# Patient Record
Sex: Male | Born: 1965 | Race: White | Hispanic: No | Marital: Married | Smoking: Never smoker
Health system: Southern US, Community
[De-identification: ages and names within clinical notes are randomized; demographics above are authoritative.]

## PROBLEM LIST (undated history)

## (undated) DIAGNOSIS — N200 Calculus of kidney: Secondary | ICD-10-CM

## (undated) DIAGNOSIS — S060XAA Concussion with loss of consciousness status unknown, initial encounter: Secondary | ICD-10-CM

## (undated) DIAGNOSIS — G5600 Carpal tunnel syndrome, unspecified upper limb: Secondary | ICD-10-CM

## (undated) DIAGNOSIS — Z87442 Personal history of urinary calculi: Secondary | ICD-10-CM

## (undated) DIAGNOSIS — H9313 Tinnitus, bilateral: Secondary | ICD-10-CM

## (undated) DIAGNOSIS — S060X9A Concussion with loss of consciousness of unspecified duration, initial encounter: Secondary | ICD-10-CM

## (undated) HISTORY — PX: CYSTOSCOPY: SUR368

## (undated) HISTORY — PX: BILATERAL CARPAL TUNNEL RELEASE: SHX6508

---

## 2016-01-05 ENCOUNTER — Other Ambulatory Visit: Payer: Self-pay | Admitting: Internal Medicine

## 2016-01-05 DIAGNOSIS — R1031 Right lower quadrant pain: Secondary | ICD-10-CM

## 2016-01-05 DIAGNOSIS — R1084 Generalized abdominal pain: Secondary | ICD-10-CM

## 2018-02-24 ENCOUNTER — Emergency Department (HOSPITAL_COMMUNITY): Payer: No Typology Code available for payment source

## 2018-02-24 ENCOUNTER — Emergency Department (HOSPITAL_COMMUNITY)
Admission: EM | Admit: 2018-02-24 | Discharge: 2018-02-24 | Disposition: A | Discharge status: Home / Self Care | Payer: No Typology Code available for payment source | Attending: Emergency Medicine | Admitting: Emergency Medicine

## 2018-02-24 ENCOUNTER — Other Ambulatory Visit: Payer: Self-pay

## 2018-02-24 ENCOUNTER — Encounter (HOSPITAL_COMMUNITY): Payer: Self-pay

## 2018-02-24 DIAGNOSIS — N200 Calculus of kidney: Secondary | ICD-10-CM | POA: Diagnosis not present

## 2018-02-24 DIAGNOSIS — R1032 Left lower quadrant pain: Secondary | ICD-10-CM | POA: Diagnosis present

## 2018-02-24 HISTORY — DX: Concussion with loss of consciousness of unspecified duration, initial encounter: S06.0X9A

## 2018-02-24 HISTORY — DX: Concussion with loss of consciousness status unknown, initial encounter: S06.0XAA

## 2018-02-24 HISTORY — DX: Calculus of kidney: N20.0

## 2018-02-24 LAB — CBC
HEMATOCRIT: 46.7 % (ref 39.0–52.0)
Hemoglobin: 16.2 g/dL (ref 13.0–17.0)
MCH: 30.5 pg (ref 26.0–34.0)
MCHC: 34.7 g/dL (ref 30.0–36.0)
MCV: 87.8 fL (ref 78.0–100.0)
Platelets: 253 10*3/uL (ref 150–400)
RBC: 5.32 MIL/uL (ref 4.22–5.81)
RDW: 12.7 % (ref 11.5–15.5)
WBC: 5.9 10*3/uL (ref 4.0–10.5)

## 2018-02-24 LAB — COMPREHENSIVE METABOLIC PANEL
ALK PHOS: 73 U/L (ref 38–126)
ALT: 39 U/L (ref 0–44)
AST: 34 U/L (ref 15–41)
Albumin: 4.6 g/dL (ref 3.5–5.0)
Anion gap: 11 (ref 5–15)
BILIRUBIN TOTAL: 1.8 mg/dL — AB (ref 0.3–1.2)
BUN: 17 mg/dL (ref 6–20)
CALCIUM: 9.5 mg/dL (ref 8.9–10.3)
CO2: 23 mmol/L (ref 22–32)
CREATININE: 1.1 mg/dL (ref 0.61–1.24)
Chloride: 107 mmol/L (ref 98–111)
Glucose, Bld: 107 mg/dL — ABNORMAL HIGH (ref 70–99)
Potassium: 4.1 mmol/L (ref 3.5–5.1)
Sodium: 141 mmol/L (ref 135–145)
TOTAL PROTEIN: 7.9 g/dL (ref 6.5–8.1)

## 2018-02-24 LAB — URINALYSIS, ROUTINE W REFLEX MICROSCOPIC
BILIRUBIN URINE: NEGATIVE
GLUCOSE, UA: NEGATIVE mg/dL
Ketones, ur: NEGATIVE mg/dL
LEUKOCYTES UA: NEGATIVE
Nitrite: NEGATIVE
PH: 8 (ref 5.0–8.0)
Protein, ur: 30 mg/dL — AB
RBC / HPF: >50 RBC/hpf — ABNORMAL HIGH (ref 0–5)
SPECIFIC GRAVITY, URINE: 1.023 (ref 1.005–1.030)

## 2018-02-24 LAB — LIPASE, BLOOD: LIPASE: 31 U/L (ref 11–51)

## 2018-02-24 MED ORDER — ONDANSETRON 4 MG PO TBDP: 4.0000 mg | ORAL_TABLET | Freq: Three times a day (TID) | ORAL | 0 refills | Status: AC | PRN

## 2018-02-24 MED ORDER — ONDANSETRON HCL 4 MG/2ML IJ SOLN
4.0000 mg | Freq: Once | INTRAMUSCULAR | Status: AC
  Administered 2018-02-24: 4 mg via INTRAVENOUS
  Filled 2018-02-24: qty 2

## 2018-02-24 MED ORDER — TAMSULOSIN HCL 0.4 MG PO CAPS: 0.4000 mg | ORAL_CAPSULE | Freq: Every day | ORAL | 0 refills | Status: DC

## 2018-02-24 MED ORDER — OXYCODONE-ACETAMINOPHEN 5-325 MG PO TABS: 1.0000 | ORAL_TABLET | Freq: Four times a day (QID) | ORAL | 0 refills | Status: AC | PRN

## 2018-02-24 MED ORDER — CEPHALEXIN 500 MG PO CAPS: 500.0000 mg | ORAL_CAPSULE | Freq: Four times a day (QID) | ORAL | 0 refills | Status: DC

## 2018-02-24 MED ORDER — SODIUM CHLORIDE 0.9 % IV BOLUS
1000.0000 mL | Freq: Once | INTRAVENOUS | Status: AC
  Administered 2018-02-24: 1000 mL via INTRAVENOUS

## 2018-02-24 MED ORDER — IBUPROFEN 800 MG PO TABS: 800.0000 mg | ORAL_TABLET | Freq: Three times a day (TID) | ORAL | 0 refills | Status: AC

## 2018-02-24 MED ORDER — IBUPROFEN 800 MG PO TABS: 800.0000 mg | ORAL_TABLET | Freq: Three times a day (TID) | ORAL | 0 refills | Status: DC

## 2018-02-24 MED ORDER — ONDANSETRON 4 MG PO TBDP: 4.0000 mg | ORAL_TABLET | Freq: Three times a day (TID) | ORAL | 0 refills | Status: DC | PRN

## 2018-02-24 MED ORDER — KETOROLAC TROMETHAMINE 30 MG/ML IJ SOLN
30.0000 mg | Freq: Once | INTRAMUSCULAR | Status: AC
  Administered 2018-02-24: 30 mg via INTRAVENOUS
  Filled 2018-02-24: qty 1

## 2018-02-24 MED ORDER — MORPHINE SULFATE (PF) 4 MG/ML IV SOLN
4.0000 mg | Freq: Once | INTRAVENOUS | Status: AC
  Administered 2018-02-24: 4 mg via INTRAVENOUS
  Filled 2018-02-24: qty 1

## 2018-02-24 MED ORDER — CEPHALEXIN 500 MG PO CAPS: 500.0000 mg | ORAL_CAPSULE | Freq: Four times a day (QID) | ORAL | 0 refills | Status: AC

## 2018-02-24 NOTE — ED Provider Notes (Signed)
[facility]-EMERGENCY DEPT Provider Note   CSN: [id number] Arrival date & time: 02/24/18  4098   History   Chief Complaint Chief Complaint  Patient presents with  . Abdominal Pain    HPI Nathan Dodson is a 52 y.o. male with history of nephrolithiasis who presents to the emergency department with complaints of abdominal pain which started shortly prior to arrival after eating breakfast this morning around 07:30.  Patient states pain is located in the left lower quadrant/left suprapubic area, it has been constant since onset and progressively worsening.  Current pain is a 10 out of 10 in severity without specific alleviating or aggravating factors.  No interventions prior to arrival.  He does report associated nausea without vomiting as well as urinary urgency with urine that appears darker than usual.  Denies fever, chills, vomiting, diarrhea, blood in stool, testicular pain/swelling, or dysuria.  Patient does have a history of kidney stones however this was a long ago he cannot tell if this is similar.  HPI  Past Medical History:  Diagnosis Date  . Concussion   . Kidney stone     There are no active problems to display for this patient.   History reviewed. No pertinent surgical history.      Home Medications    Prior to Admission medications   Not on File    Family History History reviewed. No pertinent family history.  Social History Social History   Tobacco Use  . Smoking status: Never Smoker  . Smokeless tobacco: Never Used  Substance Use Topics  . Alcohol use: Not Currently  . Drug use: Not Currently     Allergies   Bactrim [sulfamethoxazole-trimethoprim] and Ciprofloxacin   Review of Systems Review of Systems  Constitutional: Negative for chills and fever.  Respiratory: Negative for shortness of breath.   Cardiovascular: Negative for chest pain.  Gastrointestinal: Positive for abdominal pain and nausea. Negative for blood in  stool, constipation, diarrhea and vomiting.  Genitourinary: Positive for urgency. Negative for dysuria, hematuria, scrotal swelling and testicular pain.       Positive for dark in urine.  All other systems reviewed and are negative.    Physical Exam Updated Vital Signs BP (!) 147/91 (BP Location: Right Arm)   Pulse 71   Temp (!) 97.5 F (36.4 C) (Oral)   Resp 20   Ht 5\' 10"  (1.778 m)   Wt 104.8 kg (231 lb)   SpO2 100%   BMI 33.15 kg/m   Physical Exam  Constitutional: He appears well-developed and well-nourished.  Non-toxic appearance. He appears distressed (Patient appears uncomfortable, pacing throughout the room.).  HENT:  Head: Normocephalic and atraumatic.  Eyes: Conjunctivae are normal. Right eye exhibits no discharge. Left eye exhibits no discharge.  Cardiovascular: Normal rate and regular rhythm.  No murmur heard. Pulmonary/Chest: Breath sounds normal. No respiratory distress. He has no wheezes. He has no rales.  Abdominal: Soft. He exhibits no distension. There is no tenderness. There is no rigidity, no rebound, no guarding, no CVA tenderness, no tenderness at McBurney's point and negative Murphy's sign.  Neurological: He is alert.  Clear speech.   Skin: Skin is warm and dry. No rash noted.  Psychiatric: He has a normal mood and affect. His behavior is normal.  Nursing note and vitals reviewed.   ED Treatments / Results  Labs Results for orders placed or performed during the hospital encounter of 02/24/18  Lipase, blood  Result Value Ref Range   Lipase 31  11 - 51 U/L  Comprehensive metabolic panel  Result Value Ref Range   Sodium 141 135 - 145 mmol/L   Potassium 4.1 3.5 - 5.1 mmol/L   Chloride 107 98 - 111 mmol/L   CO2 23 22 - 32 mmol/L   Glucose, Bld 107 (H) 70 - 99 mg/dL   BUN 17 6 - 20 mg/dL   Creatinine, Ser 4.09 0.61 - 1.24 mg/dL   Calcium 9.5 8.9 - 81.1 mg/dL   Total Protein 7.9 6.5 - 8.1 g/dL   Albumin 4.6 3.5 - 5.0 g/dL   AST 34 15 - 41 U/L   ALT  39 0 - 44 U/L   Alkaline Phosphatase 73 38 - 126 U/L   Total Bilirubin 1.8 (H) 0.3 - 1.2 mg/dL   GFR calc non Af Amer >60 >60 mL/min   GFR calc Af Amer >60 >60 mL/min   Anion gap 11 5 - 15  CBC  Result Value Ref Range   WBC 5.9 4.0 - 10.5 K/uL   RBC 5.32 4.22 - 5.81 MIL/uL   Hemoglobin 16.2 13.0 - 17.0 g/dL   HCT 91.4 78.2 - 95.6 %   MCV 87.8 78.0 - 100.0 fL   MCH 30.5 26.0 - 34.0 pg   MCHC 34.7 30.0 - 36.0 g/dL   RDW 21.3 08.6 - 57.8 %   Platelets 253 150 - 400 K/uL  Urinalysis, Routine w reflex microscopic  Result Value Ref Range   Color, Urine AMBER (A) YELLOW   APPearance CLOUDY (A) CLEAR   Specific Gravity, Urine 1.023 1.005 - 1.030   pH 8.0 5.0 - 8.0   Glucose, UA NEGATIVE NEGATIVE mg/dL   Hgb urine dipstick LARGE (A) NEGATIVE   Bilirubin Urine NEGATIVE NEGATIVE   Ketones, ur NEGATIVE NEGATIVE mg/dL   Protein, ur 30 (A) NEGATIVE mg/dL   Nitrite NEGATIVE NEGATIVE   Leukocytes, UA NEGATIVE NEGATIVE   RBC / HPF >50 (H) 0 - 5 RBC/hpf   WBC, UA 0-5 0 - 5 WBC/hpf   Bacteria, UA MANY (A) NONE SEEN   Squamous Epithelial / LPF 0-5 0 - 5   Mucus PRESENT    EKG None  Radiology Ct Renal Stone Study  Result Date: 02/24/2018 CLINICAL DATA:  Left side abdominal pain EXAM: CT ABDOMEN AND PELVIS WITHOUT CONTRAST TECHNIQUE: Multidetector CT imaging of the abdomen and pelvis was performed following the standard protocol without IV contrast. COMPARISON:  None. FINDINGS: Lower chest: Lung bases are clear. No effusions. Heart is normal size. Hepatobiliary: No focal hepatic abnormality. Gallbladder unremarkable. Pancreas: No focal abnormality or ductal dilatation. Spleen: No focal abnormality.  Normal size. Adrenals/Urinary Tract: Mild left hydronephrosis. 3 mm distal left ureteral stone. Bilateral nonobstructing stones. Adrenal glands and urinary bladder unremarkable. Stomach/Bowel: Stomach, large and small bowel grossly unremarkable. Vascular/Lymphatic: No evidence of aneurysm or  adenopathy. Reproductive: No visible focal abnormality. Other: No free fluid or free air. Small bilateral inguinal hernias containing fat. Musculoskeletal: No acute bony abnormality. IMPRESSION: Bilateral nephrolithiasis. 3 mm distal left ureteral stone with mild left hydronephrosis. Electronically Signed   By: Charlett Nose M.D.   On: 02/24/2018 09:57    Procedures Procedures (including critical care time)  Medications Ordered in ED Medications  ketorolac (TORADOL) 30 MG/ML injection 30 mg (has no administration in time range)  sodium chloride 0.9 % bolus 1,000 mL (1,000 mLs Intravenous New Bag/Given 02/24/18 0931)  morphine 4 MG/ML injection 4 mg (4 mg Intravenous Given 02/24/18 0924)  ondansetron (ZOFRAN) injection 4  mg (4 mg Intravenous Given 02/24/18 0924)     Initial Impression / Assessment and Plan / ED Course  I have reviewed the triage vital signs and the nursing notes.  Pertinent labs & imaging results that were available during my care of the patient were reviewed by me and considered in my medical decision making (see chart for details).   Patient presents to the emergency department with complaint of abdominal pain.  Patient appears uncomfortable, nontoxic, BP is elevated, doubt HTN emergency at this time.  No peritoneal signs on exam.  DDX: Nephrolithiasis, pyelonephritis, UTI, appendicitis, diverticulitis, bowel obstruction/perforation, dissection, with nephrolithiasis as most likely at this time.  Will evaluate with labs and CT renal study, will give fluids, morphine, and Zofran.  Work-up reviewed: No leukocytosis.  No anemia.  No significant electrolyte derangements.  LFTs and lipase are WNL.  CT renal study confirms 3 mm distal left ureteral stone with mild left hydronephrosis, renal fxn WNL, patient urinalysis does show many bacteria as well as hematuria, it is nitrite and leukocyte negative only 0-5 white blood cells.- spoke with supervising physician Dr. Deretha EmoryZackowski- recommends  abx coverage with keflex as well as flomax and pain control with urology follow up.   11:40: RE-EVAL: Discussed results with family and patient at bedside. Following Toradol patient is feeling much better, he states that he feels ready to go home, he is tolerating PO.   Patient with 3 mm kidney stone diagnosed on CT study, renal function preserved, will provide ibuprofen, percocet, tamsulosin, and zofran, will additionally cover for infection with keflex. [place] Cont[place] Substance reporting System queried. Strainer provided. Urology follow up. I discussed results, treatment plan, need for follow-up, and return precautions with the patient. Provided opportunity for questions, patient confirmed understanding and is in agreement with plan.   Findings and plan of care discussed with supervising physician Dr. Deretha EmoryZackowski- in agreement with plan.   Final Clinical Impressions(s) / ED Diagnoses   Final diagnoses:  Kidney stone    ED Discharge Orders        Ordered    ibuprofen (ADVIL,MOTRIN) 800 MG tablet  3 times daily     02/24/18 1149    oxyCODONE-acetaminophen (PERCOCET/ROXICET) 5-325 MG tablet  Every 6 hours PRN     02/24/18 1149    tamsulosin (FLOMAX) 0.4 MG CAPS capsule  Daily after supper     02/24/18 1149    ondansetron (ZOFRAN ODT) 4 MG disintegrating tablet  Every 8 hours PRN     02/24/18 1149    cephALEXin (KEFLEX) 500 MG capsule  4 times daily     02/24/18 1149       Zeferino Mounts, Kelleys IslandSamantha R, PA-C 02/24/18 1519    Vanetta MuldersZackowski, Scott, MD 02/27/18 1603

## 2018-02-24 NOTE — Discharge Instructions (Addendum)
You were seen in the emergency department today and found to have a kidney stone on the left side.  We are sending you home with the following medications: -Flomax-this is a medicine to help pass the kidney stone, take this once daily with a meal -Ibuprofen 800 mg-this is a nonsteroidal anti-inflammatory medicine to help with pain and out with passing the stone, take this every 8 hours as needed for pain.  Take this with food as it can cause stomach upset and at worst stomach bleeding.  Do not take other NSAIDs such as Motrin, Aleve, Advil, Mobic, naproxen when taking this medicine -Percocet-take this 1 to 2 tablets every 6 hours as needed for severe pain.  Do not drive or operate heavy machinery when taking this medicine do not drink alcohol with this medicine keep this out of the reach of small children.  This has the potential for addicting qualities.  This also has Tylenol in it to be sure to avoid taking more than the maximum daily dose of Tylenol if you decide to take any extra Tylenol. -Zofran-some medicine for nausea, take this every 8 hours as needed for nausea and vomiting Keflex-this is an antibiotic, there was some bacteria in your urine, we want to ensure that this is not infection as well, please take this 4 times daily.  We have prescribed you new medication(s) today. Discuss the medications prescribed today with your pharmacist as they can have adverse effects and interactions with your other medicines including over the counter and prescribed medications. Seek medical evaluation if you start to experience new or abnormal symptoms after taking one of these medicines, seek care immediately if you start to experience difficulty breathing, feeling of your throat closing, facial swelling, or rash as these could be indications of a more serious allergic reaction   Please follow-up with your urologist Dr. Alvester MorinBell in the next 3 days for reevaluation.  Return to the ER for new or worsening symptoms  including but not limited to worsening pain, inability to keep fluids down, fever, or any other concerns.   Additionally see your primary care provider within 1 month for

## 2018-02-24 NOTE — ED Triage Notes (Signed)
Pt presents with c/o abdominal pain on the left side that started this morning after eating breakfast. Pt reports some nausea but denies any vomiting or diarrhea.

## 2018-02-24 NOTE — ED Notes (Signed)
Patient transported to CT 

## 2018-02-24 NOTE — ED Notes (Signed)
STRAINER GIVEN AT DISCHARGE

## 2018-02-25 LAB — URINE CULTURE: CULTURE: NO GROWTH

## 2019-08-04 IMAGING — CT CT RENAL STONE PROTOCOL
2 of 4 series · 17 of 46 positions shown, 19 images · non-contrast
Comparison: None.

CLINICAL DATA: Left side abdominal pain

EXAM:
CT ABDOMEN AND PELVIS WITHOUT CONTRAST
TECHNIQUE: Multidetector CT imaging of the abdomen and pelvis was performed
following the standard protocol without IV contrast.

[Series 2: axial st · axial · 0.74mm/px · z∈[-466,-66]mm · 14 of 92 slices shown, 16 images]
[im 6/92  soft-tissue]
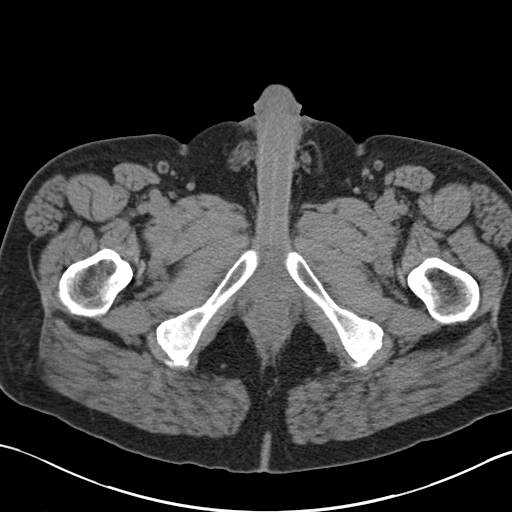
[im 6/92  bone]
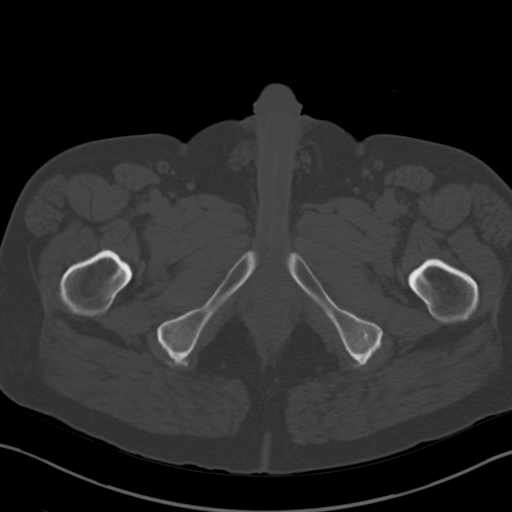
[im 11/92  soft-tissue]
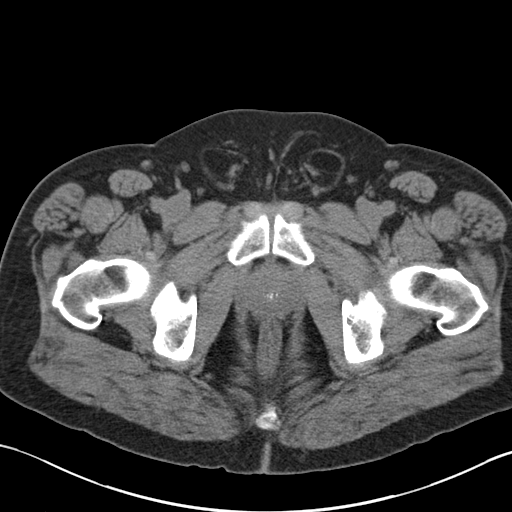
[im 17/92  soft-tissue]
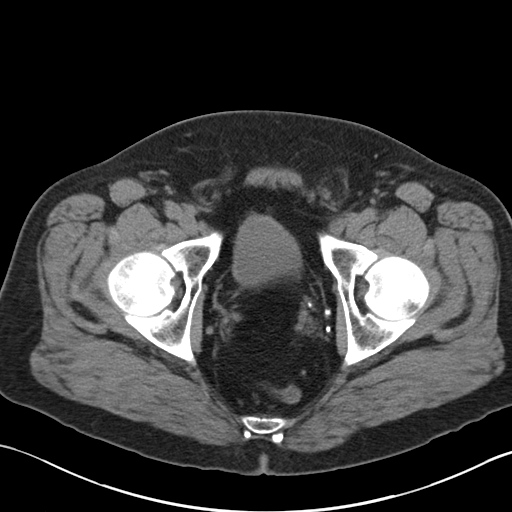
[im 27/92  soft-tissue]
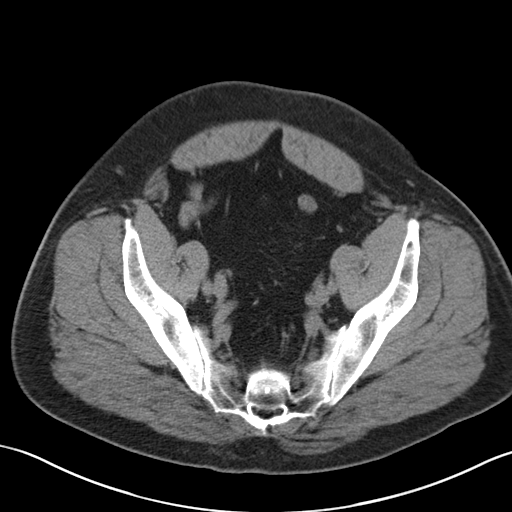
[im 33/92  soft-tissue]
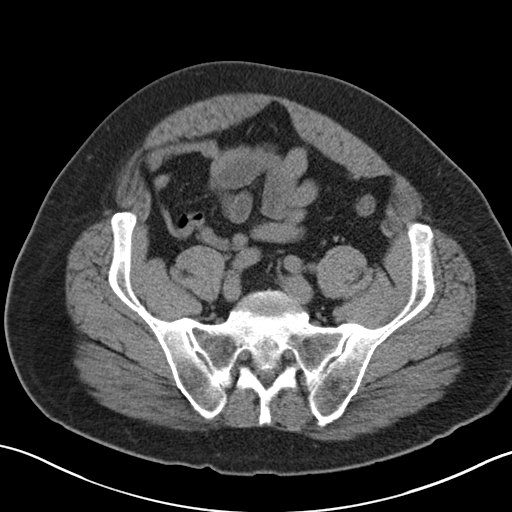
[im 38/92  soft-tissue]
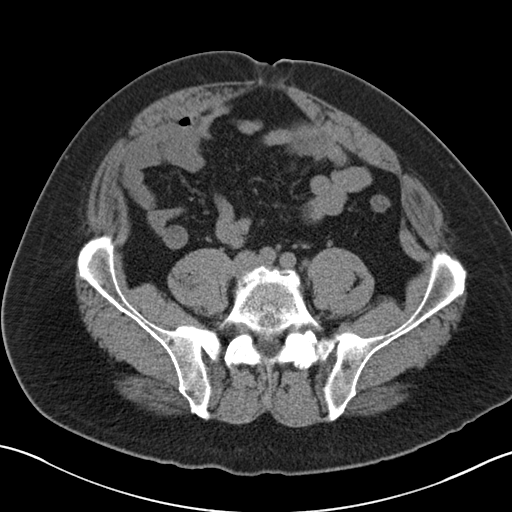
[im 43/92  soft-tissue]
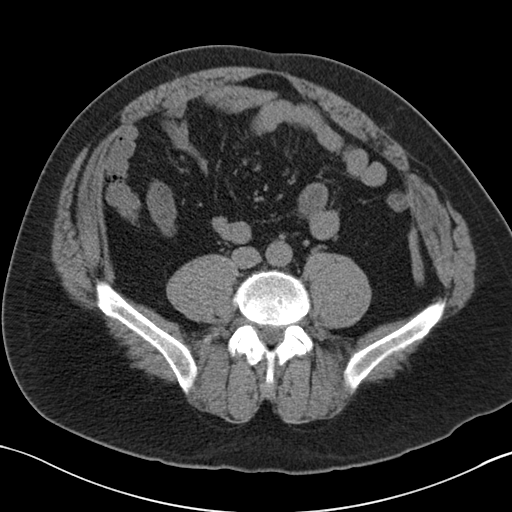
[im 49/92  soft-tissue]
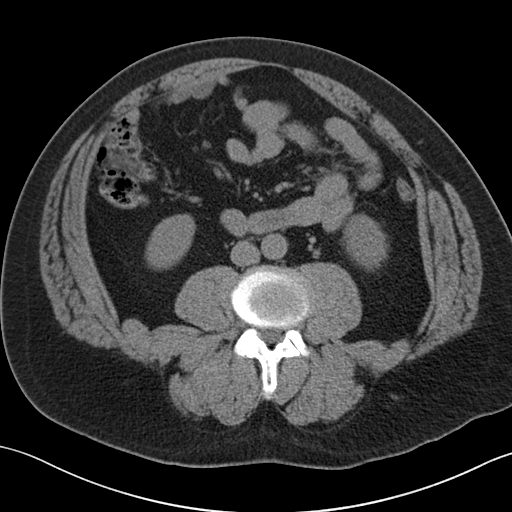
[im 54/92  soft-tissue]
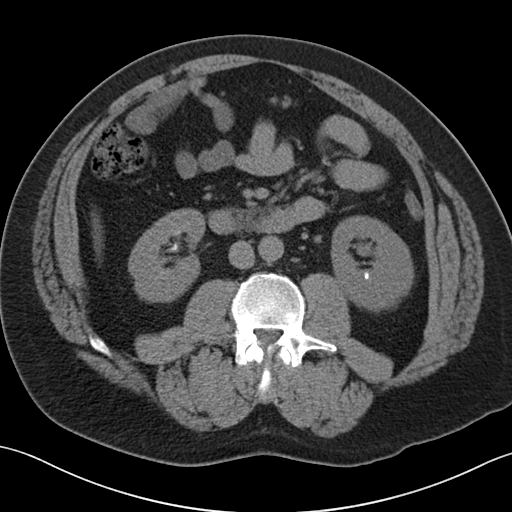
[im 54/92  bone]
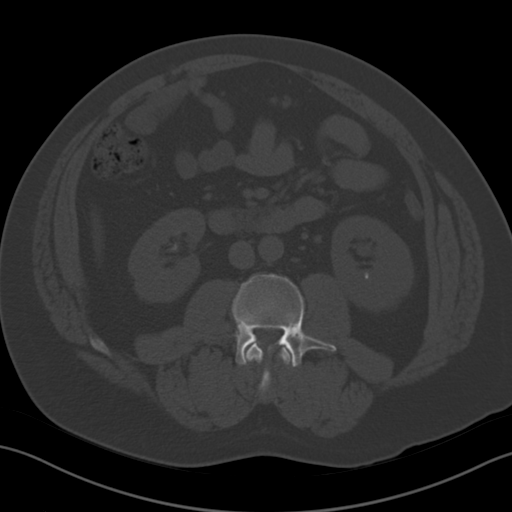
[im 59/92  soft-tissue]
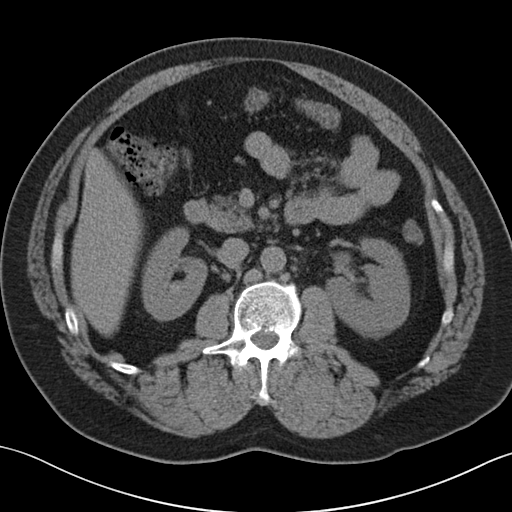
[im 70/92  soft-tissue]
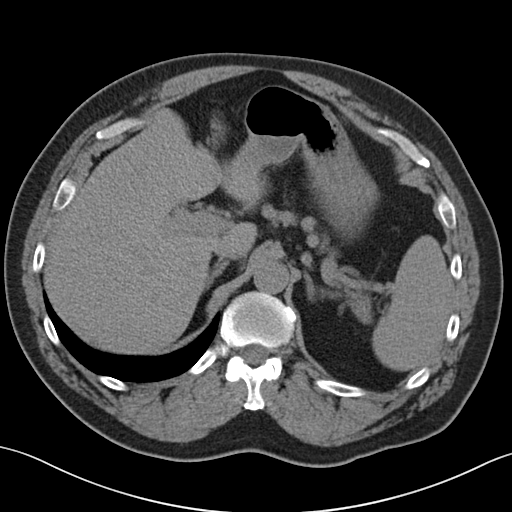
[im 75/92  soft-tissue]
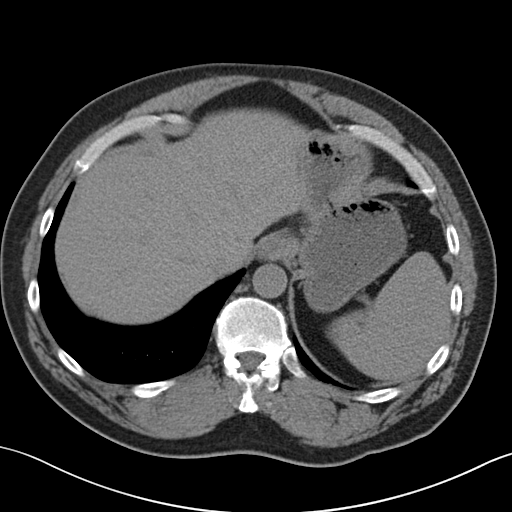
[im 81/92  soft-tissue]
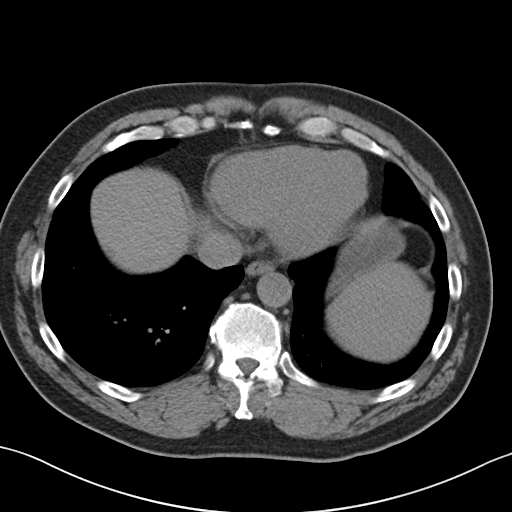
[im 86/92  soft-tissue]
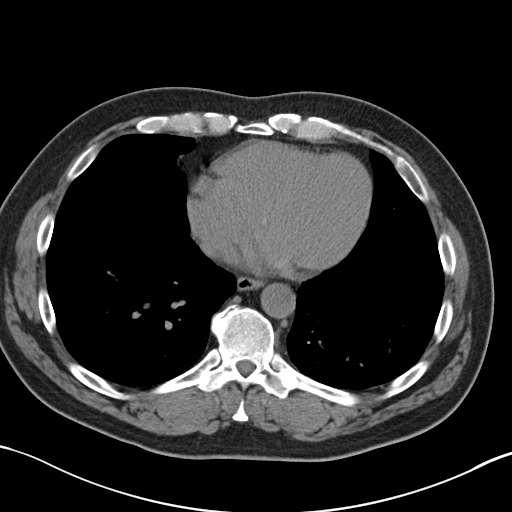

[Series 4: coronal · coronal · 0.74mm/px · 3 of 151 slices shown]
[im 51/151  soft-tissue]
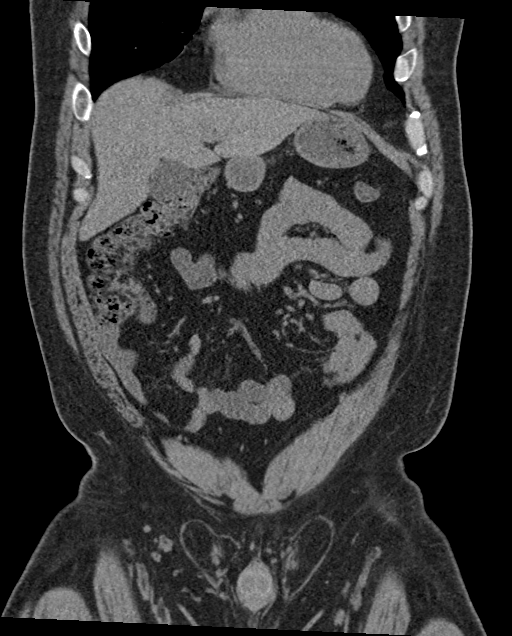
[im 67/151  soft-tissue]
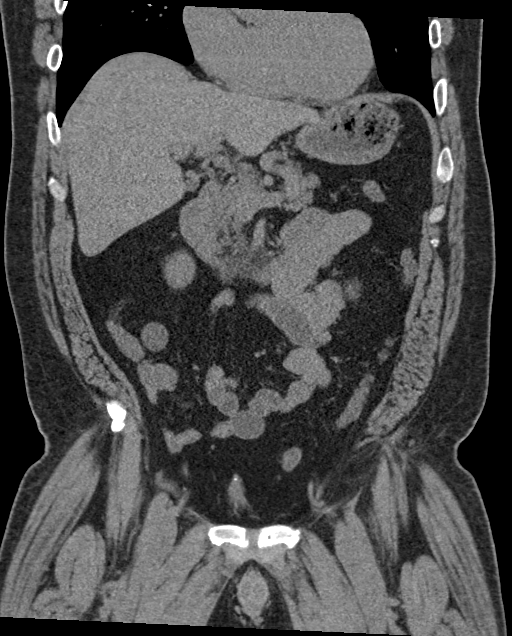
[im 84/151  soft-tissue]
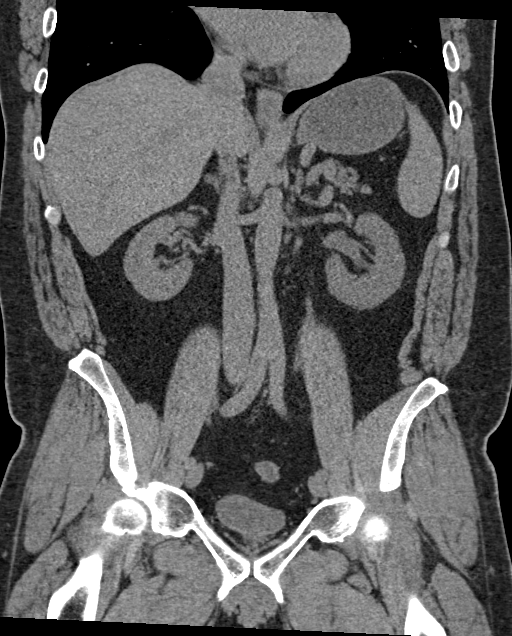

[17 of 46 positions shown; findings below may reference images not displayed]

FINDINGS: Lower chest: Lung bases are clear. No effusions. Heart is normal
size.

Hepatobiliary: No focal hepatic abnormality. Gallbladder
unremarkable.

Pancreas: No focal abnormality or ductal dilatation.

Spleen: No focal abnormality.  Normal size.

Adrenals/Urinary Tract: Mild left hydronephrosis. 3 mm distal left
ureteral stone. Bilateral nonobstructing stones. Adrenal glands and
urinary bladder unremarkable.

Stomach/Bowel: Stomach, large and small bowel grossly unremarkable.

Vascular/Lymphatic: No evidence of aneurysm or adenopathy.

Reproductive: No visible focal abnormality.

Other: No free fluid or free air. Small bilateral inguinal hernias
containing fat.

Musculoskeletal: No acute bony abnormality.
IMPRESSION: Bilateral nephrolithiasis. 3 mm distal left ureteral stone with mild
left hydronephrosis.

## 2020-12-19 ENCOUNTER — Encounter (HOSPITAL_COMMUNITY): Payer: Self-pay

## 2020-12-19 ENCOUNTER — Emergency Department (HOSPITAL_COMMUNITY)
Admission: EM | Admit: 2020-12-19 | Discharge: 2020-12-19 | Disposition: A | Discharge status: Home / Self Care | Payer: No Typology Code available for payment source | Attending: Emergency Medicine | Admitting: Emergency Medicine

## 2020-12-19 ENCOUNTER — Emergency Department (HOSPITAL_COMMUNITY): Payer: No Typology Code available for payment source

## 2020-12-19 DIAGNOSIS — R12 Heartburn: Secondary | ICD-10-CM | POA: Diagnosis not present

## 2020-12-19 DIAGNOSIS — R0789 Other chest pain: Secondary | ICD-10-CM | POA: Diagnosis not present

## 2020-12-19 DIAGNOSIS — M79602 Pain in left arm: Secondary | ICD-10-CM | POA: Diagnosis not present

## 2020-12-19 DIAGNOSIS — M25512 Pain in left shoulder: Secondary | ICD-10-CM | POA: Insufficient documentation

## 2020-12-19 DIAGNOSIS — R079 Chest pain, unspecified: Secondary | ICD-10-CM

## 2020-12-19 LAB — BASIC METABOLIC PANEL
Anion gap: 7 (ref 5–15)
BUN: 14 mg/dL (ref 6–20)
CO2: 22 mmol/L (ref 22–32)
Calcium: 8.7 mg/dL — ABNORMAL LOW (ref 8.9–10.3)
Chloride: 109 mmol/L (ref 98–111)
Creatinine, Ser: 0.99 mg/dL (ref 0.61–1.24)
GFR, Estimated: >60 mL/min (ref >60)
Glucose, Bld: 102 mg/dL — ABNORMAL HIGH (ref 70–99)
Potassium: 4 mmol/L (ref 3.5–5.1)
Sodium: 138 mmol/L (ref 135–145)

## 2020-12-19 LAB — CBC WITH DIFFERENTIAL/PLATELET
Abs Immature Granulocytes: 0.09 10*3/uL — ABNORMAL HIGH (ref 0.00–0.07)
Basophils Absolute: 0.1 10*3/uL (ref 0.0–0.1)
Basophils Relative: 1 %
Eosinophils Absolute: 0.4 10*3/uL (ref 0.0–0.5)
Eosinophils Relative: 7 %
HCT: 44.8 % (ref 39.0–52.0)
Hemoglobin: 15 g/dL (ref 13.0–17.0)
Immature Granulocytes: 2 %
Lymphocytes Relative: 29 %
Lymphs Abs: 1.7 10*3/uL (ref 0.7–4.0)
MCH: 30.5 pg (ref 26.0–34.0)
MCHC: 33.5 g/dL (ref 30.0–36.0)
MCV: 91.2 fL (ref 80.0–100.0)
Monocytes Absolute: 0.6 10*3/uL (ref 0.1–1.0)
Monocytes Relative: 10 %
Neutro Abs: 3 10*3/uL (ref 1.7–7.7)
Neutrophils Relative %: 51 %
Platelets: 200 10*3/uL (ref 150–400)
RBC: 4.91 MIL/uL (ref 4.22–5.81)
RDW: 12.6 % (ref 11.5–15.5)
WBC: 5.8 10*3/uL (ref 4.0–10.5)
nRBC: 0 % (ref 0.0–0.2)

## 2020-12-19 LAB — TROPONIN I (HIGH SENSITIVITY)
Troponin I (High Sensitivity): 5 ng/L (ref <18)
Troponin I (High Sensitivity): 3 ng/L (ref <18)

## 2020-12-19 NOTE — Discharge Instructions (Signed)
All the blood work for your heart today was normal and your EKG was normal.  No signs of heart attack or issues with your heart.  Your blood pressure here has been fine.  The rest of your lab work was also normal.  This could be heartburn from recent medication.  However if you start developing more frequent pain or pain with any activity you do need to be evaluated by your regular doctor and may need to see a cardiologist.  If the pain returns and is severe and you are having shortness of breath, vomiting or feel like you might pass out please return to the emergency room.

## 2020-12-19 NOTE — ED Provider Notes (Signed)
Emergency Medicine Provider Triage Evaluation Note  Nathan Dodson , a 55 y.o. male  was evaluated in triage.  Pt complains of chest pain.  Patient reports about 30 minutes prior to arrival he was woken from sleep with midsternal chest pain.  He reports this pain lasted a few minutes and that some tingling in his left arm.  Symptoms have now completely resolved.  He denies any lightheaded or syncope.  Did not have any vomiting.  No personal history problems but does have family history of MI.  Noted that his blood pressure was elevated activity symptoms at home.  Review of Systems  Positive: Chest pain, arm tingling Negative: Fever, vomiting, SOB, syncope  Physical Exam  BP (!) 156/103 (BP Location: Right Arm)   Pulse 62   Temp 97.8 F (36.6 C)   Resp 18   SpO2 100%  Gen:   Awake, no distress   Resp:  Normal effort  MSK:   Moves extremities without difficulty  Other:    Medical Decision Making  Medically screening exam initiated at 5:04 AM.  Appropriate orders placed.  MARTIE FULGHAM was informed that the remainder of the evaluation will be completed by another provider, this initial triage assessment does not replace that evaluation, and the importance of remaining in the ED until their evaluation is complete.     Dartha Lodge, PA-C 12/19/20 0932    Shon Baton, MD 12/19/20 [phone]

## 2020-12-19 NOTE — ED Triage Notes (Signed)
Pt states that he woke up with CP appx 30 minutes ago and L arm tingling which has now resolved. Neuro intact bilaterally.

## 2020-12-19 NOTE — ED Provider Notes (Signed)
[facility] EMERGENCY DEPARTMENT Provider Note   CSN: [id number] Arrival date & time: 12/19/20  0454     History Chief Complaint  Patient presents with  . Chest Pain    Nathan Dodson is a 55 y.o. male.  The history is provided by the patient.  Chest Pain Chest pain location: central pain. Pain quality: burning and tightness   Pain radiates to:  L shoulder and L arm Pain severity:  Moderate Onset quality:  Sudden Duration:  1 hour Timing:  Constant Progression:  Resolved Chronicity:  New Context comment:  Woke from sleep Relieved by:  None tried Worsened by:  Nothing Ineffective treatments:  None tried Associated symptoms: heartburn   Associated symptoms: no abdominal pain, no anorexia, no back pain, no cough, no diaphoresis, no dizziness, no fever, no nausea, no shortness of breath and no weakness   Risk factors: no smoking   Risk factors comment:  Hx of carpal tunnel and kidney stone.  father with MI in his 80's and grandfather with MI in his 28's.  No siblings with heart problems      Past Medical History:  Diagnosis Date  . Concussion   . Kidney stone     There are no problems to display for this patient.   History reviewed. No pertinent surgical history.     No family history on file.  Social History   Tobacco Use  . Smoking status: Never Smoker  . Smokeless tobacco: Never Used  Substance Use Topics  . Alcohol use: Not Currently  . Drug use: Not Currently    Home Medications Prior to Admission medications   Medication Sig Start Date End Date Taking? Authorizing Provider  acetaminophen (TYLENOL) 500 MG tablet Take 1,000 mg by mouth daily as needed.    [provider]  cephALEXin (KEFLEX) 500 MG capsule Take 1 capsule (500 mg total) by mouth 4 (four) times daily. 02/24/18   Petrucelli, Samantha R, PA-C  ibuprofen (ADVIL,MOTRIN) 800 MG tablet Take 1 tablet (800 mg total) by mouth 3 (three) times daily. 02/24/18    Petrucelli, Samantha R, PA-C  ondansetron (ZOFRAN ODT) 4 MG disintegrating tablet Take 1 tablet (4 mg total) by mouth every 8 (eight) hours as needed for nausea or vomiting. 02/24/18   Petrucelli, Samantha R, PA-C  oxyCODONE-acetaminophen (PERCOCET/ROXICET) 5-325 MG tablet Take 1-2 tablets by mouth every 6 (six) hours as needed for severe pain. 02/24/18   Petrucelli, Samantha R, PA-C  tamsulosin (FLOMAX) 0.4 MG CAPS capsule Take 1 capsule (0.4 mg total) by mouth daily after supper. 02/24/18   Petrucelli, Samantha R, PA-C    Allergies    Bactrim [sulfamethoxazole-trimethoprim] and Ciprofloxacin  Review of Systems   Review of Systems  Constitutional: Negative for diaphoresis and fever.  Respiratory: Negative for cough and shortness of breath.   Cardiovascular: Positive for chest pain.  Gastrointestinal: Positive for heartburn. Negative for abdominal pain, anorexia and nausea.  Musculoskeletal: Negative for back pain.  Neurological: Negative for dizziness and weakness.  All other systems reviewed and are negative.   Physical Exam Updated Vital Signs BP 127/83   Pulse (!) 56   Temp 97.8 F (36.6 C)   Resp 12   SpO2 96%   Physical Exam Vitals and nursing note reviewed.  Constitutional:      General: He is not in acute distress.    Appearance: He is well-developed and normal weight.  HENT:     Head: Normocephalic and atraumatic.  Mouth/Throat:     Mouth: Mucous membranes are moist.  Eyes:     Conjunctiva/sclera: Conjunctivae normal.     Pupils: Pupils are equal, round, and reactive to light.  Cardiovascular:     Rate and Rhythm: Normal rate and regular rhythm.     Pulses: Normal pulses.     Heart sounds: No murmur heard.   Pulmonary:     Effort: Pulmonary effort is normal. No respiratory distress.     Breath sounds: Normal breath sounds. No wheezing or rales.  Abdominal:     General: There is no distension.     Palpations: Abdomen is soft.     Tenderness: There is no  abdominal tenderness. There is no guarding or rebound.  Musculoskeletal:        General: No tenderness. Normal range of motion.     Cervical back: Normal range of motion and neck supple.     Right lower leg: No edema.     Left lower leg: No edema.  Skin:    General: Skin is warm and dry.     Findings: No erythema or rash.  Neurological:     Mental Status: He is alert and oriented to person, place, and time. Mental status is at baseline.  Psychiatric:        Mood and Affect: Mood normal.        Behavior: Behavior normal.     ED Results / Procedures / Treatments   Labs (all labs ordered are listed, but only abnormal results are displayed) Labs Reviewed  BASIC METABOLIC PANEL - Abnormal; Notable for the following components:      Result Value   Glucose, Bld 102 (*)    Calcium 8.7 (*)    All other components within normal limits  CBC WITH DIFFERENTIAL/PLATELET - Abnormal; Notable for the following components:   Abs Immature Granulocytes 0.09 (*)    All other components within normal limits  TROPONIN I (HIGH SENSITIVITY)  TROPONIN I (HIGH SENSITIVITY)    EKG EKG Interpretation  Date/Time:  Tuesday Dec 19 2020 05:03:33 EDT Ventricular Rate:  70 PR Interval:  172 QRS Duration: 86 QT Interval:  358 QTC Calculation: 386 R Axis:   55 Text Interpretation: Normal sinus rhythm Normal ECG No old tracing to compare Confirmed by Dione Booze ([id number]) on 12/19/2020 6:47:22 AM   Radiology DG Chest 2 View  Result Date: 12/19/2020 CLINICAL DATA:  Chest pain. EXAM: CHEST - 2 VIEW COMPARISON:  12/02/2019. FINDINGS: Mediastinum hilar structures normal. Heart size normal. No focal infiltrate. No pleural effusion or pneumothorax. Degenerative changes and scoliosis thoracic spine. IMPRESSION: No acute cardiopulmonary disease. Electronically Signed   By: Maisie Fus  Register   On: 12/19/2020 05:33    Procedures Procedures   Medications Ordered in ED Medications - No data to display  ED  Course  I have reviewed the triage vital signs and the nursing notes.  Pertinent labs & imaging results that were available during my care of the patient were reviewed by me and considered in my medical decision making (see chart for details).    MDM Rules/Calculators/A&P                          Patient is a 55 year old male presenting today with nonspecific chest pain.  He went to Bed feeling completely normal and woke up around 4 AM with the discomfort.  He reported it felt like severe indigestion in the center part of his  chest and then caused numbness and tingling in the left arm.  Patient has normal pulses in all extremities.  Blood pressure here on my evaluation is 119/83.  No history of cardiac risk factors other than his family history.  Patient's EKG today is within normal limits, chest x-ray within normal limits.  CBC, BMP and troponin are all within normal limits.  Given patient's symptoms started around an hour before he arrived will need a delta troponin.  He remains pain-free for the last 4 hours now.  He denies any infectious symptoms concerning for pneumonia or COVID.  He has no abdominal pain concerning for cholelithiasis, pancreatitis, hepatitis or acute bowel issue.  Could be reflux as he has been taking Valtrex and took his last 1 last night but has not been eating different foods or excessively before bed.  9:34 AM Delta troponin is within normal limits at 3.  Patient still is pain-free.  Blood pressure in bilateral upper extremities is the same and normal.  Low suspicion at this time for PE, dissection, pericarditis or pericardial effusion.  Findings discussed with the patient.  Given he is otherwise low risk he can follow-up with his PCP if he has any further symptoms for outpatient evaluation.  Patient stable for discharge  MDM Number of Diagnoses or Management Options   Amount and/or Complexity of Data Reviewed Clinical lab tests: ordered and reviewed Tests in the  radiology section of CPT: ordered and reviewed Tests in the medicine section of CPT: ordered and reviewed Independent visualization of images, tracings, or specimens: yes     Final Clinical Impression(s) / ED Diagnoses Final diagnoses:  Nonspecific chest pain    Rx / DC Orders ED Discharge Orders    None       Gwyneth Sprout, MD 12/19/20 [phone]

## 2021-04-27 ENCOUNTER — Emergency Department (HOSPITAL_COMMUNITY)
Admission: EM | Admit: 2021-04-27 | Discharge: 2021-04-27 | Disposition: A | Discharge status: Home / Self Care | Payer: No Typology Code available for payment source | Attending: Emergency Medicine | Admitting: Emergency Medicine

## 2021-04-27 DIAGNOSIS — T783XXA Angioneurotic edema, initial encounter: Secondary | ICD-10-CM | POA: Insufficient documentation

## 2021-04-27 DIAGNOSIS — K148 Other diseases of tongue: Secondary | ICD-10-CM | POA: Diagnosis present

## 2021-04-27 LAB — BASIC METABOLIC PANEL
Anion gap: 9 (ref 5–15)
BUN: 12 mg/dL (ref 6–20)
CO2: 22 mmol/L (ref 22–32)
Calcium: 8.5 mg/dL — ABNORMAL LOW (ref 8.9–10.3)
Chloride: 106 mmol/L (ref 98–111)
Creatinine, Ser: 1.11 mg/dL (ref 0.61–1.24)
GFR, Estimated: >60 mL/min (ref >60)
Glucose, Bld: 153 mg/dL — ABNORMAL HIGH (ref 70–99)
Potassium: 4.2 mmol/L (ref 3.5–5.1)
Sodium: 137 mmol/L (ref 135–145)

## 2021-04-27 LAB — CBC WITH DIFFERENTIAL/PLATELET
Abs Immature Granulocytes: 0.12 10*3/uL — ABNORMAL HIGH (ref 0.00–0.07)
Basophils Absolute: 0.1 10*3/uL (ref 0.0–0.1)
Basophils Relative: 1 %
Eosinophils Absolute: 0.1 10*3/uL (ref 0.0–0.5)
Eosinophils Relative: 1 %
HCT: 45.7 % (ref 39.0–52.0)
Hemoglobin: 15.1 g/dL (ref 13.0–17.0)
Immature Granulocytes: 1 %
Lymphocytes Relative: 17 %
Lymphs Abs: 1.5 10*3/uL (ref 0.7–4.0)
MCH: 30.5 pg (ref 26.0–34.0)
MCHC: 33 g/dL (ref 30.0–36.0)
MCV: 92.3 fL (ref 80.0–100.0)
Monocytes Absolute: 0.3 10*3/uL (ref 0.1–1.0)
Monocytes Relative: 3 %
Neutro Abs: 6.8 10*3/uL (ref 1.7–7.7)
Neutrophils Relative %: 77 %
Platelets: 257 10*3/uL (ref 150–400)
RBC: 4.95 MIL/uL (ref 4.22–5.81)
RDW: 12.3 % (ref 11.5–15.5)
WBC: 8.8 10*3/uL (ref 4.0–10.5)
nRBC: 0 % (ref 0.0–0.2)

## 2021-04-27 MED ORDER — EPINEPHRINE 0.3 MG/0.3ML IJ SOAJ
0.3000 mg | Freq: Once | INTRAMUSCULAR | Status: AC
  Administered 2021-04-27: 0.3 mg via INTRAMUSCULAR
  Filled 2021-04-27: qty 0.3

## 2021-04-27 MED ORDER — FAMOTIDINE IN NACL 20-0.9 MG/50ML-% IV SOLN
20.0000 mg | Freq: Once | INTRAVENOUS | Status: AC
  Administered 2021-04-27: 20 mg via INTRAVENOUS
  Filled 2021-04-27: qty 50

## 2021-04-27 MED ORDER — EPINEPHRINE 0.3 MG/0.3ML IJ SOAJ: 0.3000 mg | Freq: Once | INTRAMUSCULAR | Status: DC

## 2021-04-27 MED ORDER — METHYLPREDNISOLONE SODIUM SUCC 125 MG IJ SOLR
125.0000 mg | Freq: Once | INTRAMUSCULAR | Status: AC
  Administered 2021-04-27: 125 mg via INTRAVENOUS
  Filled 2021-04-27: qty 2

## 2021-04-27 MED ORDER — EPINEPHRINE 0.3 MG/0.3ML IJ SOAJ: 0.3000 mg | INTRAMUSCULAR | 0 refills | Status: DC | PRN

## 2021-04-27 MED ORDER — PREDNISONE 20 MG PO TABS: ORAL_TABLET | ORAL | 0 refills | Status: AC

## 2021-04-27 NOTE — ED Triage Notes (Signed)
Pt here POV d/t tongue and throat swelling. Pt underwent sedation today for kidney stones. When driving home pt began having swelling. Took benadryl and came to ED

## 2021-04-27 NOTE — ED Provider Notes (Signed)
Emergency Medicine Provider Triage Evaluation Note  Nathan Dodson , a 55 y.o. male  was evaluated in triage.  Pt complains of tongue and throat swelling. He had a procedure for a kidney stone just pta and was intubated for this. He had no symptoms post op but on the drive home his sxs developed. He has taken 50 mg benadryl.  Review of Systems  Positive: Tongue swelling/throat swelling Negative: Rashes, sob  Physical Exam  There were no vitals taken for this visit. Gen:   Awake, no distress   Resp:  Normal effort  MSK:   Moves extremities without difficulty  Other:  Swelling to the right side of the tongue, no lip swelling, no rashes noted, no wheezing  Medical Decision Making  Medically screening exam initiated at 2:44 PM.  Appropriate orders placed.  HINTON LUELLEN was informed that the remainder of the evaluation will be completed by another provider, this initial triage assessment does not replace that evaluation, and the importance of remaining in the ED until their evaluation is complete.     Karrie Meres, [place] 04/27/21 1446    Arby Barrette, MD 04/27/21 1453

## 2021-04-27 NOTE — ED Provider Notes (Signed)
[facility] EMERGENCY DEPARTMENT Provider Note   CSN: [id number] Arrival date & time: 04/27/21  1436     History Chief Complaint  Patient presents with   Allergic Reaction    Nathan Dodson is a 55 y.o. male.  Patient is a 55 year old male who presents with tongue swelling.  He had a recent surgery today for attempted stone retrieval for kidney stone.  He did undergo general anesthesia.  He states that when he woke up his tongue felt a little numb and a little sore on the right side which he thought was from the intubation.  When he got home, he felt like his tongue was swollen and it continued to get more swollen as he was at home.  This started about 45 minutes ago.  He does not have any lip swelling.  No difficulty swallowing or breathing at this point.  No rash or itching.  He has had allergic reactions before to antibiotics including Cipro and Bactrim but no swelling of his tongue before.  He is not on ACE inhibitor's.  He did receive an epinephrine shot in triage on arrival.  He says it has not gotten any worse but it has not started to get any better yet.  He did take 50 mg of Benadryl orally prior to arrival.      Past Medical History:  Diagnosis Date   Concussion    Kidney stone     There are no problems to display for this patient.   No past surgical history on file.     No family history on file.  Social History   Tobacco Use   Smoking status: Never   Smokeless tobacco: Never  Substance Use Topics   Alcohol use: Not Currently   Drug use: Not Currently    Home Medications Prior to Admission medications   Medication Sig Start Date End Date Taking? Authorizing Provider  acetaminophen (TYLENOL) 500 MG tablet Take 1,000 mg by mouth every 6 (six) hours as needed (for pain).   Yes [provider]  diphenhydrAMINE (BENADRYL) 25 mg capsule Take 50 mg by mouth once as needed (for allergic reactions).   Yes [provider]   EPINEPHrine 0.3 mg/0.3 mL IJ SOAJ injection Inject 0.3 mg into the muscle as needed for anaphylaxis. 04/27/21  Yes Rolan Bucco, MD  hydrocortisone (ANUSOL-HC) 2.5 % rectal cream Place 1 application rectally daily as needed (for itching). 04/04/21  Yes [provider]  predniSONE (DELTASONE) 20 MG tablet 2 tabs po daily x 4 days 04/27/21  Yes Rolan Bucco, MD  tadalafil (CIALIS) 20 MG tablet Take 20 mg by mouth daily as needed (as directed).   Yes [provider]  tamsulosin (FLOMAX) 0.4 MG CAPS capsule Take 1 capsule (0.4 mg total) by mouth daily after supper. Patient taking differently: Take 0.4 mg by mouth daily after breakfast. 02/24/18  Yes Petrucelli, Samantha R, PA-C  valACYclovir (VALTREX) 500 MG tablet Take 500 mg by mouth 2 (two) times daily as needed (as directed). 04/11/21  Yes [provider]  cephALEXin (KEFLEX) 500 MG capsule Take 1 capsule (500 mg total) by mouth 4 (four) times daily. Patient not taking: No sig reported 02/24/18   Petrucelli, Samantha R, PA-C  ibuprofen (ADVIL,MOTRIN) 800 MG tablet Take 1 tablet (800 mg total) by mouth 3 (three) times daily. Patient not taking: No sig reported 02/24/18   Petrucelli, Samantha R, PA-C  ondansetron (ZOFRAN ODT) 4 MG disintegrating tablet Take 1 tablet (4  mg total) by mouth every 8 (eight) hours as needed for nausea or vomiting. Patient not taking: No sig reported 02/24/18   Petrucelli, Samantha R, PA-C  oxyCODONE-acetaminophen (PERCOCET/ROXICET) 5-325 MG tablet Take 1-2 tablets by mouth every 6 (six) hours as needed for severe pain. Patient not taking: No sig reported 02/24/18   Petrucelli, Samantha R, PA-C    Allergies    Other, Ciprofloxacin, and Sulfamethoxazole-trimethoprim  Review of Systems   Review of Systems  Constitutional:  Negative for chills, diaphoresis, fatigue and fever.  HENT:  Negative for congestion, rhinorrhea and sneezing.        Tongue swelling  Eyes: Negative.   Respiratory:   Negative for cough, chest tightness and shortness of breath.   Cardiovascular:  Negative for chest pain and leg swelling.  Gastrointestinal:  Negative for abdominal pain, blood in stool, diarrhea, nausea and vomiting.  Genitourinary:  Negative for difficulty urinating, flank pain, frequency and hematuria.  Musculoskeletal:  Negative for arthralgias and back pain.  Skin:  Negative for rash.  Neurological:  Negative for dizziness, speech difficulty, weakness, numbness and headaches.   Physical Exam Updated Vital Signs BP 123/75   Pulse (!) 55   Temp (!) 97.1 F (36.2 C) (Temporal)   Resp 15   SpO2 99%   Physical Exam Constitutional:      Appearance: He is well-developed.  HENT:     Head: Normocephalic and atraumatic.     Mouth/Throat:     Comments: Patient has moderate swelling to his tongue.  There is no swelling to the posterior pharynx.  No lip swelling.  No trismus.  Uvula is midline.  No stridor.  He is controlling his secretions. Eyes:     Pupils: Pupils are equal, round, and reactive to light.  Cardiovascular:     Rate and Rhythm: Normal rate and regular rhythm.     Heart sounds: Normal heart sounds.  Pulmonary:     Effort: Pulmonary effort is normal. No respiratory distress.     Breath sounds: Normal breath sounds. No wheezing or rales.  Chest:     Chest wall: No tenderness.  Abdominal:     General: Bowel sounds are normal.     Palpations: Abdomen is soft.     Tenderness: There is no abdominal tenderness. There is no guarding or rebound.  Musculoskeletal:        General: Normal range of motion.     Cervical back: Normal range of motion and neck supple.  Lymphadenopathy:     Cervical: No cervical adenopathy.  Skin:    General: Skin is warm and dry.     Findings: No rash.  Neurological:     Mental Status: He is alert and oriented to person, place, and time.    ED Results / Procedures / Treatments   Labs (all labs ordered are listed, but only abnormal results  are displayed) Labs Reviewed  BASIC METABOLIC PANEL - Abnormal; Notable for the following components:      Result Value   Glucose, Bld 153 (*)    Calcium 8.5 (*)    All other components within normal limits  CBC WITH DIFFERENTIAL/PLATELET - Abnormal; Notable for the following components:   Abs Immature Granulocytes 0.12 (*)    All other components within normal limits    EKG EKG Interpretation  Date/Time:  Friday April 27 2021 14:56:00 EDT Ventricular Rate:  89 PR Interval:  171 QRS Duration: 98 QT Interval:  353 QTC Calculation: 430 R Axis:  75 Text Interpretation: Sinus rhythm since last tracing no significant change Confirmed by Rolan Bucco [phone]) on 04/27/2021 3:42:52 PM  Radiology No results found.  Procedures Procedures   Medications Ordered in ED Medications  EPINEPHrine (EPI-PEN) injection 0.3 mg (0.3 mg Intramuscular Given 04/27/21 1452)  methylPREDNISolone sodium succinate (SOLU-MEDROL) 125 mg/2 mL injection 125 mg (125 mg Intravenous Given 04/27/21 1528)  famotidine (PEPCID) IVPB 20 mg premix (0 mg Intravenous Stopped 04/27/21 1601)    ED Course  I have reviewed the triage vital signs and the nursing notes.  Pertinent labs & imaging results that were available during my care of the patient were reviewed by me and considered in my medical decision making (see chart for details).    MDM Rules/Calculators/A&P                           15:45 has had some improvement in swelling 1615: continuing to have improvement in tongue swelling, no worsening  17:30 ongoing improvement  Patient is a 55 year old male who presents with angioedema after recent surgical procedure.  He did not have an associated rash.  He is not on an ACE inhibitor.  He was given epinephrine injection along with Solu-Medrol and Pepcid.  He had taken 50 mg of Benadryl just prior to arrival.  He was monitored for over 4 hours and had ongoing improvement.  On discharge his symptoms had  completely resolved.  He was discharged home in good condition.  He is not on any ongoing medications that he had taken prior to this event.  He was encouraged to follow-up with his PCP on Monday or Tuesday and may need to follow-up with an allergist.  He was given a prescription for a 4-day course of prednisone along with an EpiPen.  Strict return precautions were given.  CRITICAL CARE Performed by: Rolan Bucco Total critical care time: 90 minutes Critical care time was exclusive of separately billable procedures and treating other patients. Critical care was necessary to treat or prevent imminent or life-threatening deterioration. Critical care was time spent personally by me on the following activities: development of treatment plan with patient and/or surrogate as well as nursing, discussions with consultants, evaluation of patient's response to treatment, examination of patient, obtaining history from patient or surrogate, ordering and performing treatments and interventions, ordering and review of laboratory studies, ordering and review of radiographic studies, pulse oximetry and re-evaluation of patient's condition.   Final Clinical Impression(s) / ED Diagnoses Final diagnoses:  Angioedema, initial encounter    Rx / DC Orders ED Discharge Orders          Ordered    EPINEPHrine 0.3 mg/0.3 mL IJ SOAJ injection  As needed        04/27/21 1914    predniSONE (DELTASONE) 20 MG tablet        04/27/21 1914             Rolan Bucco, MD 04/27/21 1916

## 2021-05-01 ENCOUNTER — Other Ambulatory Visit: Payer: Self-pay | Admitting: Urology

## 2021-05-02 ENCOUNTER — Other Ambulatory Visit: Payer: Self-pay

## 2021-05-02 ENCOUNTER — Encounter (HOSPITAL_COMMUNITY): Payer: Self-pay | Admitting: Urology

## 2021-05-02 NOTE — Patient Instructions (Signed)
DUE TO COVID-19 ONLY ONE VISITOR IS ALLOWED TO COME WITH YOU AND STAY IN THE WAITING ROOM ONLY DURING PRE OP AND PROCEDURE.   **NO VISITORS ARE ALLOWED IN THE SHORT STAY AREA OR RECOVERY ROOM!!**   Your procedure is scheduled on: Friday, Oct. 7, 2022   Report to [facility] Main Entrance    Report to admitting at 11:45 AM   Call this number if you have problems the morning of surgery [phone]   Do not eat food :After Midnight.   May have liquids until  11:00 AM  day of surgery  CLEAR LIQUID DIET  Foods Allowed                                                                     Foods Excluded  Water, Black Coffee and tea, regular and decaf                             liquids that you cannot  Plain Jell-O in any flavor  (No red)                                           see through such as: Fruit ices (not with fruit pulp)                                     milk, soups, orange juice              Iced Popsicles (No red)                                    All solid food                                   Apple juices Sports drinks like Gatorade (No red) Lightly seasoned clear broth or consume(fat free) Sugar, honey syrup  Sample Menu Breakfast                                Lunch                                     Supper Cranberry juice                    Beef broth                            Chicken broth Jell-O                                     Grape juice  Apple juice Coffee or tea                        Jell-O                                      Popsicle                                                Coffee or tea                        Coffee or tea     Oral Hygiene is also important to reduce your risk of infection.                                    Remember - BRUSH YOUR TEETH THE MORNING OF SURGERY WITH YOUR REGULAR TOOTHPASTE   Do NOT smoke after Midnight   Take these medicines the morning of surgery with A SIP OF WATER:  Tamsulosin                              You may not have any metal on your body including jewelry, and body piercing             Do not wear lotions, powders, perfumes/cologne, or deodorant              Men may shave face and neck.   Do not bring valuables to the hospital. [facility] IS NOT             RESPONSIBLE   FOR VALUABLES.   Contacts, dentures or bridgework may not be worn into surgery.    Patients discharged on the day of surgery will not be allowed to drive home.   Special Instructions: Bring a copy of your healthcare power of attorney and living will documents         the day of surgery if you haven't scanned them before.              Please read over the following fact sheets you were given: IF YOU HAVE QUESTIONS ABOUT YOUR PRE-OP INSTRUCTIONS PLEASE CALL [phone]

## 2021-05-02 NOTE — Progress Notes (Signed)
For Short Stay: COVID SWAB appointment date: N/A Date of COVID positive in last 90 days: No   For Anesthesia: PCP - Irena Reichmann, DO Cardiologist - N/A  Chest x-ray - 12/19/20 in epic EKG - 04/27/21 in epic Stress Test - N/A ECHO - N/A Cardiac Cath - N/A Pacemaker/ICD device last checked: N/A  Sleep Study - N/A CPAP - N/A  Fasting Blood Sugar - N/A Checks Blood Sugar __N/A___ times a day  Blood Thinner Instructions: N/A Aspirin Instructions: N/A Last Dose: N/A  Activity level:  Able to exercise without chest pain and/or shortness of breath       Anesthesia review: N/A  Patient denies shortness of breath, fever, cough and chest pain at PAT appointment   Patient verbalized understanding of instructions that were given to them at the PAT appointment. Patient was also instructed that they will need to review over the PAT instructions again at home before surgery.

## 2021-05-04 ENCOUNTER — Encounter (HOSPITAL_COMMUNITY): Payer: Self-pay | Admitting: Urology

## 2021-05-04 ENCOUNTER — Ambulatory Visit (HOSPITAL_COMMUNITY): Payer: No Typology Code available for payment source

## 2021-05-04 ENCOUNTER — Ambulatory Visit (HOSPITAL_COMMUNITY)
Admission: RE | Admit: 2021-05-04 | Discharge: 2021-05-04 | Disposition: A | Discharge status: Home / Self Care | Payer: No Typology Code available for payment source | Attending: Urology | Admitting: Urology

## 2021-05-04 ENCOUNTER — Ambulatory Visit (HOSPITAL_COMMUNITY): Payer: No Typology Code available for payment source | Admitting: Certified Registered Nurse Anesthetist

## 2021-05-04 ENCOUNTER — Encounter (HOSPITAL_COMMUNITY)
Admission: RE | Disposition: A | Discharge status: Home / Self Care | Payer: Self-pay | Source: Home / Self Care | Attending: Urology

## 2021-05-04 DIAGNOSIS — E669 Obesity, unspecified: Secondary | ICD-10-CM | POA: Diagnosis not present

## 2021-05-04 DIAGNOSIS — N202 Calculus of kidney with calculus of ureter: Secondary | ICD-10-CM | POA: Diagnosis not present

## 2021-05-04 DIAGNOSIS — Z79899 Other long term (current) drug therapy: Secondary | ICD-10-CM | POA: Diagnosis not present

## 2021-05-04 DIAGNOSIS — N5201 Erectile dysfunction due to arterial insufficiency: Secondary | ICD-10-CM | POA: Diagnosis not present

## 2021-05-04 DIAGNOSIS — Z6833 Body mass index (BMI) 33.0-33.9, adult: Secondary | ICD-10-CM | POA: Insufficient documentation

## 2021-05-04 HISTORY — DX: Tinnitus, bilateral: H93.13

## 2021-05-04 HISTORY — PX: HOLMIUM LASER APPLICATION: SHX5852

## 2021-05-04 HISTORY — DX: Carpal tunnel syndrome, unspecified upper limb: G56.00

## 2021-05-04 HISTORY — PX: CYSTOSCOPY WITH RETROGRADE PYELOGRAM, URETEROSCOPY AND STENT PLACEMENT: SHX5789

## 2021-05-04 HISTORY — DX: Personal history of urinary calculi: Z87.442

## 2021-05-04 LAB — CBC
HCT: 42.6 % (ref 39.0–52.0)
Hemoglobin: 14.5 g/dL (ref 13.0–17.0)
MCH: 30.7 pg (ref 26.0–34.0)
MCHC: 34 g/dL (ref 30.0–36.0)
MCV: 90.3 fL (ref 80.0–100.0)
Platelets: 217 10*3/uL (ref 150–400)
RBC: 4.72 MIL/uL (ref 4.22–5.81)
RDW: 12.4 % (ref 11.5–15.5)
WBC: 8.2 10*3/uL (ref 4.0–10.5)
nRBC: 0 % (ref 0.0–0.2)

## 2021-05-04 LAB — BASIC METABOLIC PANEL
Anion gap: 8 (ref 5–15)
BUN: 14 mg/dL (ref 6–20)
CO2: 27 mmol/L (ref 22–32)
Calcium: 8.4 mg/dL — ABNORMAL LOW (ref 8.9–10.3)
Chloride: 102 mmol/L (ref 98–111)
Creatinine, Ser: 0.95 mg/dL (ref 0.61–1.24)
GFR, Estimated: >60 mL/min (ref >60)
Glucose, Bld: 100 mg/dL — ABNORMAL HIGH (ref 70–99)
Potassium: 4.1 mmol/L (ref 3.5–5.1)
Sodium: 137 mmol/L (ref 135–145)

## 2021-05-04 SURGERY — CYSTOURETEROSCOPY, WITH RETROGRADE PYELOGRAM AND STENT INSERTION
Anesthesia: General | Laterality: Right

## 2021-05-04 MED ORDER — ONDANSETRON HCL 4 MG/2ML IJ SOLN: 4.0000 mg | Freq: Once | INTRAMUSCULAR | Status: DC | PRN

## 2021-05-04 MED ORDER — CEFAZOLIN SODIUM-DEXTROSE 2-4 GM/100ML-% IV SOLN
2.0000 g | INTRAVENOUS | Status: AC
  Administered 2021-05-04: 2 g via INTRAVENOUS
  Filled 2021-05-04: qty 100

## 2021-05-04 MED ORDER — IOHEXOL 300 MG/ML  SOLN
INTRAMUSCULAR | Status: DC | PRN
  Administered 2021-05-04: 10 mL

## 2021-05-04 MED ORDER — SODIUM CHLORIDE 0.9 % IR SOLN
Status: DC | PRN
  Administered 2021-05-04: 9000 mL

## 2021-05-04 MED ORDER — FENTANYL CITRATE (PF) 100 MCG/2ML IJ SOLN
INTRAMUSCULAR | Status: AC
  Filled 2021-05-04: qty 2

## 2021-05-04 MED ORDER — DEXAMETHASONE SODIUM PHOSPHATE 4 MG/ML IJ SOLN
INTRAMUSCULAR | Status: DC | PRN
  Administered 2021-05-04: 10 mg via INTRAVENOUS

## 2021-05-04 MED ORDER — EPHEDRINE SULFATE-NACL 50-0.9 MG/10ML-% IV SOSY
PREFILLED_SYRINGE | INTRAVENOUS | Status: DC | PRN
  Administered 2021-05-04 (×2): 5 mg via INTRAVENOUS

## 2021-05-04 MED ORDER — KETOROLAC TROMETHAMINE 30 MG/ML IJ SOLN: 30.0000 mg | Freq: Once | INTRAMUSCULAR | Status: DC | PRN

## 2021-05-04 MED ORDER — LIDOCAINE 2% (20 MG/ML) 5 ML SYRINGE
INTRAMUSCULAR | Status: DC | PRN
  Administered 2021-05-04: 50 mg via INTRAVENOUS

## 2021-05-04 MED ORDER — TAMSULOSIN HCL 0.4 MG PO CAPS: 0.4000 mg | ORAL_CAPSULE | Freq: Every day | ORAL | 3 refills | Status: AC

## 2021-05-04 MED ORDER — ONDANSETRON HCL 4 MG/2ML IJ SOLN
INTRAMUSCULAR | Status: AC
  Filled 2021-05-04: qty 2

## 2021-05-04 MED ORDER — PROPOFOL 10 MG/ML IV BOLUS
INTRAVENOUS | Status: DC | PRN
  Administered 2021-05-04: 200 mg via INTRAVENOUS

## 2021-05-04 MED ORDER — LACTATED RINGERS IV SOLN: INTRAVENOUS | Status: DC

## 2021-05-04 MED ORDER — MIDAZOLAM HCL 2 MG/2ML IJ SOLN
INTRAMUSCULAR | Status: DC | PRN
  Administered 2021-05-04: 2 mg via INTRAVENOUS

## 2021-05-04 MED ORDER — FENTANYL CITRATE PF 50 MCG/ML IJ SOSY: 25.0000 ug | PREFILLED_SYRINGE | INTRAMUSCULAR | Status: DC | PRN

## 2021-05-04 MED ORDER — PROPOFOL 10 MG/ML IV BOLUS
INTRAVENOUS | Status: AC
  Filled 2021-05-04: qty 20

## 2021-05-04 MED ORDER — PHENYLEPHRINE 40 MCG/ML (10ML) SYRINGE FOR IV PUSH (FOR BLOOD PRESSURE SUPPORT)
PREFILLED_SYRINGE | INTRAVENOUS | Status: DC | PRN
  Administered 2021-05-04 (×2): 80 ug via INTRAVENOUS
  Administered 2021-05-04 (×2): 120 ug via INTRAVENOUS
  Administered 2021-05-04: 80 ug via INTRAVENOUS

## 2021-05-04 MED ORDER — CHLORHEXIDINE GLUCONATE 0.12 % MT SOLN
15.0000 mL | Freq: Once | OROMUCOSAL | Status: AC
  Administered 2021-05-04: 15 mL via OROMUCOSAL

## 2021-05-04 MED ORDER — MIDAZOLAM HCL 2 MG/2ML IJ SOLN
INTRAMUSCULAR | Status: AC
  Filled 2021-05-04: qty 2

## 2021-05-04 MED ORDER — FENTANYL CITRATE (PF) 100 MCG/2ML IJ SOLN
INTRAMUSCULAR | Status: DC | PRN
  Administered 2021-05-04 (×2): 25 ug via INTRAVENOUS
  Administered 2021-05-04: 50 ug via INTRAVENOUS

## 2021-05-04 MED ORDER — ORAL CARE MOUTH RINSE: 15.0000 mL | Freq: Once | OROMUCOSAL | Status: AC

## 2021-05-04 MED ORDER — EPHEDRINE 5 MG/ML INJ
INTRAVENOUS | Status: AC
  Filled 2021-05-04: qty 5

## 2021-05-04 MED ORDER — ONDANSETRON HCL 4 MG/2ML IJ SOLN
INTRAMUSCULAR | Status: DC | PRN
  Administered 2021-05-04: 4 mg via INTRAVENOUS

## 2021-05-04 MED ORDER — DEXAMETHASONE SODIUM PHOSPHATE 10 MG/ML IJ SOLN
INTRAMUSCULAR | Status: AC
  Filled 2021-05-04: qty 1

## 2021-05-04 MED ORDER — PHENYLEPHRINE 40 MCG/ML (10ML) SYRINGE FOR IV PUSH (FOR BLOOD PRESSURE SUPPORT)
PREFILLED_SYRINGE | INTRAVENOUS | Status: AC
  Filled 2021-05-04: qty 10

## 2021-05-04 MED ORDER — LIDOCAINE HCL (PF) 2 % IJ SOLN
INTRAMUSCULAR | Status: AC
  Filled 2021-05-04: qty 5

## 2021-05-04 SURGICAL SUPPLY — 26 items
BAG COUNTER SPONGE SURGICOUNT (BAG) IMPLANT
BAG SPNG CNTER NS LX DISP (BAG)
BAG URO CATCHER STRL LF (MISCELLANEOUS) ×2 IMPLANT
BASKET LASER NITINOL 1.9FR (BASKET) IMPLANT
BASKET ZERO TIP NITINOL 2.4FR (BASKET) IMPLANT
BSKT STON RTRVL 120 1.9FR (BASKET)
BSKT STON RTRVL ZERO TP 2.4FR (BASKET)
CATH INTERMIT  6FR 70CM (CATHETERS) ×2 IMPLANT
CLOTH BEACON ORANGE TIMEOUT ST (SAFETY) ×1 IMPLANT
EXTRACTOR STONE 1.7FRX115CM (UROLOGICAL SUPPLIES) IMPLANT
GLOVE SURG ENC MOIS LTX SZ7.5 (GLOVE) ×2 IMPLANT
GOWN STRL REUS W/TWL XL LVL3 (GOWN DISPOSABLE) ×2 IMPLANT
GUIDEWIRE ANG ZIPWIRE 038X150 (WIRE) IMPLANT
GUIDEWIRE STR DUAL SENSOR (WIRE) ×3 IMPLANT
KIT TURNOVER KIT A (KITS) ×2 IMPLANT
LASER FIB FLEXIVA PULSE ID 365 (Laser) IMPLANT
MANIFOLD NEPTUNE II (INSTRUMENTS) ×2 IMPLANT
PACK CYSTO (CUSTOM PROCEDURE TRAY) ×2 IMPLANT
PENCIL SMOKE EVACUATOR (MISCELLANEOUS) IMPLANT
SHEATH URETERAL 12FRX28CM (UROLOGICAL SUPPLIES) IMPLANT
SHEATH URETERAL 12FRX35CM (MISCELLANEOUS) ×1 IMPLANT
STENT URET 6FRX26 CONTOUR (STENTS) ×1 IMPLANT
TRACTIP FLEXIVA PULS ID 200XHI (Laser) IMPLANT
TRACTIP FLEXIVA PULSE ID 200 (Laser) ×2
TUBING CONNECTING 10 (TUBING) ×2 IMPLANT
TUBING UROLOGY SET (TUBING) ×2 IMPLANT

## 2021-05-04 NOTE — Interval H&P Note (Signed)
History and Physical Interval Note:  05/04/2021 2:55 PM  Nathan Dodson  has presented today for surgery, with the diagnosis of RIGHT URETERAL CALCULI.  The various methods of treatment have been discussed with the patient and family. After consideration of risks, benefits and other options for treatment, the patient has consented to  Procedure(s) with comments: CYSTOSCOPY WITH RETROGRADE PYELOGRAM, URETEROSCOPY, STONE EXTRACTION AND STENT PLACEMENT (Right) - 1 HR HOLMIUM LASER APPLICATION (Right) as a surgical intervention.  The patient's history has been reviewed, patient examined, no change in status, stable for surgery.  I have reviewed the patient's chart and labs.  Questions were answered to the patient's satisfaction.     Ray Church, III

## 2021-05-04 NOTE — H&P (Signed)
CC/HPI: CC: Left-sided flank pain  HPI:  03/22/2019  55 year old male with a history nephrolithiasis. He has always passed the stones in the past. He presents today with complaints of severe right-sided flank pain. Pain nearly resolved after Toradol. He underwent a CT scan of the abdomen and pelvis without contrast today. This revealed a 5-6 mm proximal left ureteral calculus. He also had bilateral nonobstructing calculi. He denies any fever, chills, nausea, vomiting.   04/02/19: Patient with above-noted history. He returns today for follow-up. At prior office visit CT imaging revealed an approximately 5-6 mm calculus in the left proximal ureter with mild hydronephrosis. He was also noted to have additional nonobstructive stones bilaterally. Today he states that he has not seen any stones pass in the interim. However, he has not had any significant pain for the past 3 days. No current complaints of left flank or abdominal pain. He does have some mild urinary urgency, which is increased from his baseline. He denies dysuria, gross hematuria, fever, chills, nausea, or vomiting.   04/09/19: Patient with above noted history. He returns today for follow up. On KUB at prior OV, the previously noted calculus within the lower pole of the left kidney, appeared to be within the left renal pelvis/left UPJ area. This stone measures approximately 5 x 8 mm. Along the expected anatomical course of the left ureter previously noted proximal ureteral calculus was noted to be in the vicinity of the left UVJ. Today he denies seeing a stone pass in the interim. He denies any interval symptoms, however. He denies any interval or current flank or abdominal pain. He denies exacerbation of voiding symptoms, dysuria, or gross hematuria. No fevers, chills, nausea, or vomiting.    05/07/19: He returns today for follow up. He states that he did see one stone pass in the interim. He denies any interval episodes of left flank or abdominal  pain. He denies exacerbation in voiding symptoms. No gross hematuria, fevers, chills, nausea, or vomiting. He remains on Tadalafil for ED and finds this effective. He would like to purchase this today.   08/06/2019  Patient decided against surgical management of his left ureteral calculus at that time. However, he has changed insurance and he is now starting to have left-sided flank pain. He is scheduled for ureteroscopy next week. He also has erectile dysfunction that responds well to Cialis. He would like to purchase some today.   10/26/2020  Patient has been having some intermittent left-sided flank pain for a couple months. This is in his upper back. Especially occurs when he is trying to sleep but can occur throughout the day. No hematuria dysuria. He does have a history nephrolithiasis and surgical procedure on that left side. He had to stop taking Cialis because of heartburn. He would like to try sildenafil. He denies nitrates /nitroglycerin   11/17/2020: CT stone protocol study obtained prior to today's office visit. This showed a nonobstructing calculi in the right kidney. No obvious obstructive signs bilaterally, no ureteral or bladder calculi identified. Since stopping Cialis in beginning sildenafil, using only 20 mg been noting it to be effective, he has had resolution of his back pain. He has had no further side effects with the new oral PD 5 inhibitor. He continues to void at his baseline with stable, grossly non bothersome symptomatology. He denies interval stone material passage, dysuria, hematuria, changes in force of stream.   04/16/2021: Patient awoke this morning with severe right lower back and flank pain with associated nausea  and diaphoresis. Pain endorsed as similar to previous stone events. He took a tamsulosin that he keeps on hand for symptom recurrence and within 30 minutes of taking the medication his symptoms significantly improved. Still endorsing some soreness over the right  lower back but overall feeling much better than when he made the appointment earlier today. Symptoms not associated with any increased frequency/urgency or changes in force of stream. He denies dysuria, hematuria or noted stone material passage. He denies f/c. Urinalysis contain some microscopic hematuria today.     ALLERGIES: Bactrim - back pain Cipro - severe headaches    MEDICATIONS: Tamsulosin Hcl 0.4 mg capsule  Zyrtec  Sildenafil Citrate 20 mg tablet 1 tablet PO prn Pt. to take 1-5tabs po 1hr prn prior to sexual activity  Valtrex     GU PSH: Cysto Remove Stent FB Sim - 2021 Ureteroscopic laser litho, Left - 2021, Left - 2021     NON-GU PSH: None   GU PMH: ED due to arterial insufficiency - 11/17/2020, (Stable), - 10/26/2020, - 2020 Flank Pain - 11/17/2020, - 10/26/2020 Renal calculus, He has a 6-7 mm calculus in the mid to lower pole area of the right kidney. No other obvious renal calculi noted on my independent review. There was no obstructive signs. Both ureters clear. No obvious bladder calcifications identified as well. - 11/17/2020, (Stable), - 2021 History of urolithiasis - 10/26/2020 Renal colic - 2020 Ureteral calculus - 2020 Left testicular pain - 2019 Microscopic hematuria - 2019    NON-GU PMH: None   FAMILY HISTORY: 1 Daughter - Other Breast Cancer - Mother Cancer - Father Heart Attack - Father Lung Cancer - Father   SOCIAL HISTORY: Marital Status: Married Preferred Language: English; Race: White Current Smoking Status: Patient has never smoked.   Tobacco Use Assessment Completed: Used Tobacco in last 30 days? Drinks 2 caffeinated drinks per day.    REVIEW OF SYSTEMS:    GU Review Male:   Patient denies frequent urination, hard to postpone urination, burning/ pain with urination, get up at night to urinate, leakage of urine, stream starts and stops, trouble starting your stream, have to strain to urinate , erection problems, and penile pain.   Gastrointestinal (Upper):   Patient reports nausea. Patient denies vomiting and indigestion/ heartburn.  Gastrointestinal (Lower):   Patient denies diarrhea and constipation.  Constitutional:   Patient denies fever, night sweats, weight loss, and fatigue.  Skin:   Patient denies skin rash/ lesion and itching.  Eyes:   Patient denies blurred vision and double vision.  Ears/ Nose/ Throat:   Patient denies sore throat and sinus problems.  Hematologic/Lymphatic:   Patient denies swollen glands and easy bruising.  Cardiovascular:   Patient denies leg swelling and chest pains.  Respiratory:   Patient denies shortness of breath and cough.  Endocrine:   Patient denies excessive thirst.  Musculoskeletal:   right flank/abd pain. Patient reports back pain. Patient denies joint pain.  Neurological:   Patient denies headaches and dizziness.  Psychologic:   Patient denies depression and anxiety.   VITAL SIGNS:      04/16/2021 02:50 PM  BP 138/78 mmHg  Pulse 66 /min  Temperature 98.2 F / 36.7 C   MULTI-SYSTEM PHYSICAL EXAMINATION:    Constitutional: Well-nourished. No physical deformities. Normally developed. Good grooming.  Neck: Neck symmetrical, not swollen. Normal tracheal position.  Respiratory: No labored breathing, no use of accessory muscles.   Cardiovascular: Normal temperature, normal extremity pulses, no swelling, no varicosities.  Skin: No paleness, no jaundice, no cyanosis. No lesion, no ulcer, no rash.  Neurologic / Psychiatric: Oriented to time, oriented to place, oriented to person. No depression, no anxiety, no agitation.  Gastrointestinal: No mass, no tenderness, no rigidity, non obese abdomen. There is some mild tenderness over the right CVA.  Musculoskeletal: Normal gait and station of head and neck.     Complexity of Data:  Source Of History:  Patient, Medical Record Summary  Records Review:   Previous Doctor Records, Previous Hospital Records, Previous Patient Records   Urine Test Review:   Urinalysis  X-Ray Review: KUB: Reviewed Films. Discussed With Patient.  C.T. Stone Protocol: Reviewed Films. Reviewed Report.     04/16/21  Urinalysis  Urine Appearance Clear   Urine Color Yellow   Urine Glucose Neg mg/dL  Urine Bilirubin Neg mg/dL  Urine Ketones Neg mg/dL  Urine Specific Gravity 1.025   Urine Blood 1+ ery/uL  Urine pH 6.0   Urine Protein Neg mg/dL  Urine Urobilinogen 0.2 mg/dL  Urine Nitrites Neg   Urine Leukocyte Esterase Neg leu/uL  Urine WBC/hpf 0 - 5/hpf   Urine RBC/hpf 3 - 10/hpf   Urine Epithelial Cells NS (Not Seen)   Urine Bacteria Few (10-25/hpf)   Urine Mucous Not Present   Urine Yeast NS (Not Seen)   Urine Trichomonas Not Present   Urine Cystals NS (Not Seen)   Urine Casts NS (Not Seen)   Urine Sperm Not Present    PROCEDURES:         KUB - [id number]  A single view of the abdomen is obtained. A punctate appearing opacity consistent with renal calculi is noted in the mid pole area of the right kidney. A he 9 mm opacity consistent with previously identified renal calculus has transitioned into the proximal ureter easily seen between L1 and L2 vertebrae on the right side. There is an additional 3 mm opacity overlying the transverse process right L3 lumbar vertebrae that was not previously seen but is somewhat suspicious for a ureteral calculi as well. No other obvious opacities consistent with nephrolithiasis is identified on today's exam. Stable bilateral pelvic phleboliths again noted. Bladder grossly appears free of obstruction.      Patient confirmed No Neulasta OnPro Device.           Urinalysis w/Scope Dipstick Dipstick Cont'd Micro  Color: Yellow Bilirubin: Neg mg/dL WBC/hpf: 0 - 5/hpf  Appearance: Clear Ketones: Neg mg/dL RBC/hpf: 3 - 99/JTT  Specific Gravity: 1.025 Blood: 1+ ery/uL Bacteria: Few (10-25/hpf)  pH: 6.0 Protein: Neg mg/dL Cystals: NS (Not Seen)  Glucose: Neg mg/dL Urobilinogen: 0.2 mg/dL Casts: NS (Not  Seen)    Nitrites: Neg Trichomonas: Not Present    Leukocyte Esterase: Neg leu/uL Mucous: Not Present      Epithelial Cells: NS (Not Seen)      Yeast: NS (Not Seen)      Sperm: Not Present    ASSESSMENT:      ICD-10 Details  1 GU:   Flank Pain - R10.84 Right, Undiagnosed New Problem  2   Ureteral calculus - N20.1 Right, Undiagnosed New Problem   PLAN:            Medications Stop Meds: Hydrocodone-Acetaminophen 5 mg-325 mg tablet 1 tablet PO Q 6 H PRN  Start: 04/02/2019  Discontinue: 04/16/2021  - Reason: The medication cycle was completed.            Orders Labs Urine Culture  X-Rays: KUB  Schedule Return Visit/Planned Activity: Next Available Appointment - Follow up MD, Schedule Surgery          Document Letter(s):  Created for Patient: Clinical Summary         Notes:   KUB today shows the previously identified right mid pole renal calculi to have transitioned into the right proximal ureter. There is also a questionable opacity measuring around 3 mm overlying the transverse process of right L3 vertebrae that may be a 2nd ureteral calculus that has resulted in his endorsed pain/discomfort beginning earlier today. Imaging and presentation reviewed with patient's urologist. URS recommended. Pt adamant about not having ESWL. URS would be a good treatment option as well as this will result in treatment of the second and smaller opacity if indeed it is truly a ureteral calculi. At this point, patient will continue tamsulosin. He will monitor for worsening symptomatology including interval stone material passage contacting the office in the event this occurs. He has pain medication on hand in the form of Vicodin to take for severe exacerbations of pain/discomfort. Over-the-counter anti-inflammatories also recommended for mild-to-moderate pain and discomfort. Precautionary urine culture sent today to serve as baseline for his pending procedure. He will be contacted next available  to schedule ureteroscopy in the near future. With poorly controlled symptomatology including painful inability to void, uncontrollable pain/discomfort with or without associated nausea/vomiting or fever/chills emergency department follow-up and/or clinic follow-up strongly encouraged with understanding expressed by the patient in regards to these recommendations.   For ureteroscopy I described the risks which include heart attack, stroke, pulmonary embolus, death, bleeding, infection, damage to contiguous structures, positioning injury, ureteral stricture, ureteral avulsion, ureteral injury, need for ureteral stent, inability to perform ureteroscopy, need for an interval procedure, inability to clear stone burden, stent discomfort and pain.    Signed by Anne Fu, NP on 04/16/21 at 3:57 PM (EDT

## 2021-05-04 NOTE — Anesthesia Postprocedure Evaluation (Signed)
Anesthesia Post Note  Patient: Nathan Dodson  Procedure(s) Performed: CYSTOSCOPY WITH RETROGRADE PYELOGRAM, URETEROSCOPY, STONE EXTRACTION AND STENT PLACEMENT (Right) HOLMIUM LASER APPLICATION (Right)     Patient location during evaluation: PACU Anesthesia Type: General Level of consciousness: awake and alert Pain management: pain level controlled Vital Signs Assessment: post-procedure vital signs reviewed and stable Respiratory status: spontaneous breathing, nonlabored ventilation, respiratory function stable and patient connected to nasal cannula oxygen Cardiovascular status: blood pressure returned to baseline and stable Postop Assessment: no apparent nausea or vomiting Anesthetic complications: no   No notable events documented.  Last Vitals:  Vitals:   05/04/21 1204 05/04/21 1600  BP: (!) 163/95 (!) 135/93  Pulse: 69 78  Resp: 16 15  Temp: 36.7 C 36.7 C  SpO2: 99% 95%    Last Pain:  Vitals:   05/04/21 1600  TempSrc:   PainSc: 0-No pain                 Ramonita Koenig S

## 2021-05-04 NOTE — Addendum Note (Signed)
Addendum  created 05/04/21 1622 by Eilene Ghazi, MD   Order list changed, Order sets accessed, Pharmacy for encounter modified

## 2021-05-04 NOTE — Anesthesia Procedure Notes (Signed)
Procedure Name: LMA Insertion Date/Time: 05/04/2021 3:10 PM Performed by: Vanessa [place], CRNA Pre-anesthesia Checklist: Emergency Drugs available, Patient identified, Suction available and Patient being monitored Patient Re-evaluated:Patient Re-evaluated prior to induction Oxygen Delivery Method: Circle system utilized Preoxygenation: Pre-oxygenation with 100% oxygen Induction Type: IV induction Ventilation: Mask ventilation without difficulty LMA: LMA inserted LMA Size: 4.0 Number of attempts: 1 Placement Confirmation: positive ETCO2 and breath sounds checked- equal and bilateral Tube secured with: Tape Dental Injury: Teeth and Oropharynx as per pre-operative assessment

## 2021-05-04 NOTE — Discharge Instructions (Signed)

## 2021-05-04 NOTE — Op Note (Signed)
Operative Note  Preoperative diagnosis:  1.  Right ureteropelvic junction calculus  Postoperative diagnosis: 1.  Right ureteropelvic junction calculus  Procedure(s): 1.  Cystoscopy with right retrograde pyelogram, right ureteroscopy with laser lithotripsy and ureteral stent exchange  Surgeon: Modena Slater, MD  Assistants: None  Anesthesia: General  Complications: None immediate  EBL: Minimal  Specimens: 1.  None  Drains/Catheters: 1.  6 x 24 double-J ureteral stent  Intraoperative findings: 1.  Normal anterior urethra 2.  Nonobstructing prostate 3.  Bladder mucosa normal. 4.  Right retrograde pyelogram revealed no filling defect or hydronephrosis after fragmentation of stone. 5.  Ureteroscopy revealed an approximately 6 mm ureteropelvic junction calculus fragmented to tiny fragments.  Indication: 55 year old male with a right ureteropelvic junction calculus status post stent placement presents for definitive management of his stone.  Description of procedure:  The patient was identified and consent was obtained.  The patient was taken to the operating room and placed in the supine position.  The patient was placed under general anesthesia.  Perioperative antibiotics were administered.  The patient was placed in dorsal lithotomy.  Patient was prepped and draped in a standard sterile fashion and a timeout was performed.  A 21 French rigid cystoscope was advanced into the urethra and into the bladder.  Complete cystoscopy was performed with no abnormal findings.  The stent was grasped and pulled just beyond the meatus.  A wire was advanced through this and into the kidney under fluoroscopic guidance and the stent was withdrawn.  A 12 x 14 ureteral access sheath was advanced over the wire and into the ureter.  The inner sheath was withdrawn and a second wire was advanced through the sheath and into the kidney.  The entire sheath was withdrawn keeping the 2 wires in place.  One of the  wires was secured to the drape and the other wire was used to advance a 12 x 14 ureteral access sheath over the wire under continuous fluoroscopic guidance up to the renal pelvis.  The inner sheath and wire were withdrawn.  Digital ureteroscopy encountered the stone of interest which was laser fragmented on dust settings to tiny fragments.  Complete pyeloscopy was performed and any clinically significant stone fragments remaining were dusted to tiny fragments.  Once there were no other clinically significant stone fragments, I shot a retrograde pyelogram through the scope with no abnormal findings.  I withdrew the scope along with the access sheath visualizing the entire ureter upon removal.  There is no obvious ureteral calculus and no ureteral injury.  I backloaded the wire onto the rigid cystoscope followed by routine placement of a 6 x 26 double-J ureteral stent.  Fluoroscopy confirmed proximal placement and direct visualization confirmed a good coil within the bladder.  I drained the bladder and withdrew the scope.  Patient tolerated the procedure well and was stable postoperative.  Plan: Follow-up in 4 to 7 days for stent removal

## 2021-05-04 NOTE — Transfer of Care (Signed)
Immediate Anesthesia Transfer of Care Note  Patient: JABE JEANBAPTISTE  Procedure(s) Performed: CYSTOSCOPY WITH RETROGRADE PYELOGRAM, URETEROSCOPY, STONE EXTRACTION AND STENT PLACEMENT (Right) HOLMIUM LASER APPLICATION (Right)  Patient Location: PACU  Anesthesia Type:General  Level of Consciousness: awake and patient cooperative  Airway & Oxygen Therapy: Patient Spontanous Breathing and Patient connected to face mask  Post-op Assessment: Report given to RN and Post -op Vital signs reviewed and stable  Post vital signs: Reviewed and stable  Last Vitals:  Vitals Value Taken Time  BP 145/87 05/04/21 1551  Temp    Pulse 82 05/04/21 1552  Resp 9 05/04/21 1552  SpO2 99 % 05/04/21 1552  Vitals shown include unvalidated device data.  Last Pain:  Vitals:   05/04/21 1215  TempSrc:   PainSc: 0-No pain      Patients Stated Pain Goal: 3 (05/04/21 1215)  Complications: No notable events documented.

## 2021-05-04 NOTE — Anesthesia Preprocedure Evaluation (Signed)
Anesthesia Evaluation  Patient identified by MRN, date of birth, ID band Patient awake    Reviewed: Allergy & Precautions, NPO status , Patient's Chart, lab work & pertinent test results  Airway Mallampati: II  TM Distance: >3 FB Neck ROM: Full    Dental no notable dental hx.    Pulmonary neg pulmonary ROS,    Pulmonary exam normal breath sounds clear to auscultation       Cardiovascular negative cardio ROS Normal cardiovascular exam Rhythm:Regular Rate:Normal     Neuro/Psych negative neurological ROS  negative psych ROS   GI/Hepatic negative GI ROS, Neg liver ROS,   Endo/Other  obesity  Renal/GU negative Renal ROS  negative genitourinary   Musculoskeletal negative musculoskeletal ROS (+)   Abdominal   Peds negative pediatric ROS (+)  Hematology negative hematology ROS (+)   Anesthesia Other Findings   Reproductive/Obstetrics negative OB ROS                             Anesthesia Physical Anesthesia Plan  ASA: 2  Anesthesia Plan: General   Post-op Pain Management:    Induction: Intravenous  PONV Risk Score and Plan: 2 and Ondansetron, Dexamethasone and Treatment may vary due to age or medical condition  Airway Management Planned: LMA  Additional Equipment:   Intra-op Plan:   Post-operative Plan: Extubation in OR  Informed Consent: I have reviewed the patients History and Physical, chart, labs and discussed the procedure including the risks, benefits and alternatives for the proposed anesthesia with the patient or authorized representative who has indicated his/her understanding and acceptance.     Dental advisory given  Plan Discussed with: CRNA and Surgeon  Anesthesia Plan Comments:         Anesthesia Quick Evaluation

## 2021-05-05 ENCOUNTER — Encounter (HOSPITAL_COMMUNITY): Payer: Self-pay | Admitting: Urology

## 2021-05-11 DIAGNOSIS — T7840XA Allergy, unspecified, initial encounter: Secondary | ICD-10-CM | POA: Diagnosis not present

## 2021-05-11 DIAGNOSIS — Z5321 Procedure and treatment not carried out due to patient leaving prior to being seen by health care provider: Secondary | ICD-10-CM | POA: Diagnosis not present

## 2021-05-12 ENCOUNTER — Emergency Department (HOSPITAL_COMMUNITY)
Admission: EM | Admit: 2021-05-12 | Discharge: 2021-05-12 | Disposition: A | Discharge status: Home / Self Care | Payer: No Typology Code available for payment source | Attending: Emergency Medicine | Admitting: Emergency Medicine

## 2021-05-12 ENCOUNTER — Other Ambulatory Visit: Payer: Self-pay | Admitting: Urology

## 2021-05-12 ENCOUNTER — Other Ambulatory Visit: Payer: Self-pay

## 2021-05-12 ENCOUNTER — Encounter (HOSPITAL_COMMUNITY): Payer: Self-pay | Admitting: *Deleted

## 2021-05-12 MED ORDER — EPINEPHRINE 0.3 MG/0.3ML IJ SOAJ: 0.3000 mg | INTRAMUSCULAR | 0 refills | Status: AC | PRN

## 2021-05-12 NOTE — ED Triage Notes (Signed)
The pt  is here after he gave himself an epi shot  he is on antibiotics   1300 was the last antibiotic he took last dose was around 2200  he felt like his mouth  tongue and lips was swelling  he also took a benadryl  noe there is a small amount of swelling in his lt jaw area no lip or tongue swellling no rash no shortness of breath  hes feeling better now

## 2021-05-12 NOTE — Progress Notes (Signed)
Had allergic reaction to Keflex. Discontinued. No further antibiotics at this time.  Called in refill for epi pen.  Reola Mosher, MD [facility] Resident, [facility]

## 2021-05-12 NOTE — ED Notes (Signed)
Pt decide to leave despite medical advice  

## 2022-03-22 ENCOUNTER — Ambulatory Visit
Admission: RE | Admit: 2022-03-22 | Discharge: 2022-03-22 | Disposition: A | Discharge status: Home / Self Care | Payer: No Typology Code available for payment source | Source: Ambulatory Visit | Attending: Family Medicine | Admitting: Family Medicine

## 2022-03-22 ENCOUNTER — Other Ambulatory Visit: Payer: Self-pay | Admitting: Family Medicine

## 2022-03-22 DIAGNOSIS — S3992XA Unspecified injury of lower back, initial encounter: Secondary | ICD-10-CM
